# Patient Record
Sex: Male | Born: 1990 | Race: Black or African American | Hispanic: No | Marital: Single | Smoking: Current every day smoker
Health system: Southern US, Community
[De-identification: ages and names within clinical notes are randomized; demographics above are authoritative.]

## PROBLEM LIST (undated history)

## (undated) DIAGNOSIS — K279 Peptic ulcer, site unspecified, unspecified as acute or chronic, without hemorrhage or perforation: Secondary | ICD-10-CM

---
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
## 2009-05-30 ENCOUNTER — Emergency Department (HOSPITAL_COMMUNITY): Admission: EM | Admit: 2009-05-30 | Discharge: 2009-05-30 | Payer: Self-pay | Admitting: Emergency Medicine

## 2011-02-22 LAB — BASIC METABOLIC PANEL
BUN: 10 mg/dL (ref 6–23)
BUN: 7 mg/dL (ref 6–23)
BUN: 7 mg/dL (ref 6–23)
CO2: 24 mEq/L (ref 19–32)
CO2: 26 mEq/L (ref 19–32)
Calcium: 9 mg/dL (ref 8.4–10.5)
Calcium: 9 mg/dL (ref 8.4–10.5)
Chloride: 105 mEq/L (ref 96–112)
Chloride: 108 mEq/L (ref 96–112)
Chloride: 110 mEq/L (ref 96–112)
Creatinine, Ser: 0.96 mg/dL (ref 0.4–1.5)
Creatinine, Ser: 0.99 mg/dL (ref 0.4–1.5)
Creatinine, Ser: 1.01 mg/dL (ref 0.4–1.5)
GFR calc Af Amer: >60 mL/min (ref >60)
GFR calc Af Amer: >60 mL/min (ref >60)
GFR calc Af Amer: >60 mL/min (ref >60)
GFR calc Af Amer: >60 mL/min (ref >60)
GFR calc non Af Amer: >60 mL/min (ref >60)
GFR calc non Af Amer: >60 mL/min (ref >60)
GFR calc non Af Amer: >60 mL/min (ref >60)
GFR calc non Af Amer: >60 mL/min (ref >60)
GFR calc non Af Amer: >60 mL/min (ref >60)
GFR calc non Af Amer: >60 mL/min (ref >60)
Glucose, Bld: 103 mg/dL — ABNORMAL HIGH (ref 70–99)
Glucose, Bld: 125 mg/dL — ABNORMAL HIGH (ref 70–99)
Glucose, Bld: 81 mg/dL (ref 70–99)
Glucose, Bld: 83 mg/dL (ref 70–99)
Potassium: 3.6 mEq/L (ref 3.5–5.1)
Potassium: 3.8 mEq/L (ref 3.5–5.1)
Potassium: 3.9 mEq/L (ref 3.5–5.1)
Potassium: 4 mEq/L (ref 3.5–5.1)
Potassium: 4.1 mEq/L (ref 3.5–5.1)
Sodium: 137 mEq/L (ref 135–145)
Sodium: 138 mEq/L (ref 135–145)
Sodium: 138 mEq/L (ref 135–145)
Sodium: 139 mEq/L (ref 135–145)
Sodium: 140 mEq/L (ref 135–145)
Sodium: 141 mEq/L (ref 135–145)

## 2011-02-22 LAB — PHOSPHORUS
Phosphorus: 3.1 mg/dL (ref 2.3–4.6)
Phosphorus: 3.9 mg/dL (ref 2.3–4.6)

## 2011-02-22 LAB — HEPATIC FUNCTION PANEL
ALT: 133 U/L — ABNORMAL HIGH (ref 0–53)
AST: 359 U/L — ABNORMAL HIGH (ref 0–37)
Albumin: 3.6 g/dL (ref 3.5–5.2)
Total Protein: 6.8 g/dL (ref 6.0–8.3)

## 2011-02-22 LAB — URINE MICROSCOPIC-ADD ON

## 2011-02-22 LAB — ANA: Anti Nuclear Antibody(ANA): NEGATIVE

## 2011-02-22 LAB — DIFFERENTIAL
Basophils Absolute: 0 10*3/uL (ref 0.0–0.1)
Basophils Absolute: 0.1 10*3/uL (ref 0.0–0.1)
Eosinophils Absolute: 0.4 10*3/uL (ref 0.0–0.7)
Eosinophils Relative: 5 % (ref 0–5)
Eosinophils Relative: 7 % — ABNORMAL HIGH (ref 0–5)
Lymphocytes Relative: 47 % — ABNORMAL HIGH (ref 12–46)
Lymphocytes Relative: 55 % — ABNORMAL HIGH (ref 12–46)
Lymphs Abs: 3.3 10*3/uL (ref 0.7–4.0)
Monocytes Absolute: 0.6 10*3/uL (ref 0.1–1.0)
Monocytes Relative: 8 % (ref 3–12)
Neutrophils Relative %: 29 % — ABNORMAL LOW (ref 43–77)

## 2011-02-22 LAB — CARDIAC PANEL(CRET KIN+CKTOT+MB+TROPI)
CK, MB: 19.8 ng/mL — ABNORMAL HIGH (ref 0.3–4.0)
CK, MB: 20.7 ng/mL — ABNORMAL HIGH (ref 0.3–4.0)
Relative Index: 0.1 (ref 0.0–2.5)
Relative Index: 0.1 (ref 0.0–2.5)
Troponin I: 0.01 ng/mL (ref 0.00–0.06)
Troponin I: 0.01 ng/mL (ref 0.00–0.06)

## 2011-02-22 LAB — POCT I-STAT, CHEM 8
BUN: 9 mg/dL (ref 6–23)
Calcium, Ion: 1.19 mmol/L (ref 1.12–1.32)
Glucose, Bld: 78 mg/dL (ref 70–99)
TCO2: 26 mmol/L (ref 0–100)

## 2011-02-22 LAB — CK: Total CK: 37465 U/L — ABNORMAL HIGH (ref 7–232)

## 2011-02-22 LAB — COMPREHENSIVE METABOLIC PANEL
BUN: 5 mg/dL — ABNORMAL LOW (ref 6–23)
CO2: 24 mEq/L (ref 19–32)
Calcium: 9.1 mg/dL (ref 8.4–10.5)
Chloride: 106 mEq/L (ref 96–112)
Creatinine, Ser: 1.03 mg/dL (ref 0.4–1.5)
GFR calc Af Amer: >60 mL/min (ref >60)
GFR calc non Af Amer: >60 mL/min (ref >60)
Total Bilirubin: 0.7 mg/dL (ref 0.3–1.2)

## 2011-02-22 LAB — ALDOLASE: Aldolase: 403 U/L — ABNORMAL HIGH (ref <=8.1)

## 2011-02-22 LAB — MALARIA SMEAR

## 2011-02-22 LAB — CBC
HCT: 41.4 % (ref 39.0–52.0)
HCT: 42.2 % (ref 39.0–52.0)
HCT: 45.2 % (ref 39.0–52.0)
Hemoglobin: 13.5 g/dL (ref 13.0–17.0)
Hemoglobin: 15.1 g/dL (ref 13.0–17.0)
MCHC: 33 g/dL (ref 30.0–36.0)
MCHC: 33.5 g/dL (ref 30.0–36.0)
MCV: 88.9 fL (ref 78.0–100.0)
MCV: 88.9 fL (ref 78.0–100.0)
Platelets: 165 10*3/uL (ref 150–400)
Platelets: 166 10*3/uL (ref 150–400)
RBC: 4.56 MIL/uL (ref 4.22–5.81)
RBC: 5.11 MIL/uL (ref 4.22–5.81)
RDW: 14.1 % (ref 11.5–15.5)
RDW: 14.2 % (ref 11.5–15.5)
RDW: 14.6 % (ref 11.5–15.5)
WBC: 5.3 10*3/uL (ref 4.0–10.5)
WBC: 5.9 10*3/uL (ref 4.0–10.5)

## 2011-02-22 LAB — URINALYSIS, ROUTINE W REFLEX MICROSCOPIC
Glucose, UA: NEGATIVE mg/dL
Specific Gravity, Urine: 1.006 (ref 1.005–1.030)
pH: 6.5 (ref 5.0–8.0)

## 2011-02-22 LAB — JO-1 ANTIBODY-IGG: Jo-1 Antibody, IgG: <0.2 AI (ref <1.0)

## 2011-02-22 LAB — CK TOTAL AND CKMB (NOT AT ARMC)
Relative Index: 0 (ref 0.0–2.5)
Relative Index: 0.1 (ref 0.0–2.5)
Total CK: 14174 U/L — ABNORMAL HIGH (ref 7–232)

## 2011-02-22 LAB — ETHANOL: Alcohol, Ethyl (B): <5 mg/dL (ref 0–10)

## 2011-02-22 LAB — PROTIME-INR: INR: 1.1 (ref 0.00–1.49)

## 2011-02-22 LAB — RAPID URINE DRUG SCREEN, HOSP PERFORMED
Barbiturates: NOT DETECTED
Opiates: NOT DETECTED

## 2011-02-22 LAB — HEPATITIS PANEL, ACUTE
HCV Ab: NEGATIVE
Hep B C IgM: NEGATIVE
Hepatitis B Surface Ag: POSITIVE — AB

## 2011-02-22 LAB — HEPATITIS B DNA, ULTRAQUANTITATIVE, PCR: Hepatitis B DNA: >110000000 IU/mL — ABNORMAL HIGH (ref <29)

## 2011-02-22 LAB — TSH: TSH: 1.596 u[IU]/mL (ref 0.350–4.500)

## 2011-02-22 LAB — HEPATITIS B E ANTIBODY: Hep B E Ab: NEGATIVE

## 2011-02-22 LAB — HEPATITIS B SURFACE ANTIBODY,QUALITATIVE: Hep B S Ab: NEGATIVE

## 2011-02-22 LAB — HEPATITIS B E ANTIGEN: Hep B E Ag: POSITIVE

## 2011-02-22 LAB — POCT CARDIAC MARKERS: Troponin i, poc: <0.05 ng/mL (ref 0.00–0.09)

## 2011-03-23 NOTE — Discharge Summary (Signed)
NAMENORRIS, BRUMBACH NO.:  [id number]   MEDICAL RECORD NO.:  [id number]          PATIENT TYPE:  INP   LOCATION:  5530                         FACILITY:  [facility]   PHYSICIAN:  Manning Charity, MD     DATE OF BIRTH:  1991-02-08   DATE OF ADMISSION:  11/14/2008  DATE OF DISCHARGE:  11/18/2008                               DISCHARGE SUMMARY   CONSULTS:  None.   FOLLOWING PHYSICIAN:  Outpatient Clinic, Dr. Elizebeth Koller.   DISCHARGE DIAGNOSES:  1. Rhabdomyolysis.  2. Prior history of malaria.   DISPOSITION AND FOLLOWUP:  Mr. Mcpheeters Maahir will follow with Dr.  Elizebeth Koller at the Outpatient Clinic on December 05, 2008, at 1:30 p.m.  During that appointment, labs that need to be followed, workup for  polymyositis, aldolase level, ANA, and anti-AO1 antibody, malaria smear,  hepatitis B serology.  Other labs that need to be drawn with that  appointment at [facility] and a CK level.  During that appointment also  improvement and resolution of his epigastric pain need to be followed  up.  His pain as of now resolved with trail of ranitidine, consider H  Pylori antigen if symptoms does not improved.   HISTORY OF PRESENT ILLNESS:  An 20 year old with a non-significant past  medical history who presented to the emergency department complaining of  chest pain and arm pain.  He relate that the chest pain started 3 days  prior to admission.  He describes it being a sharp, is constant 8/10.  He relate that the pain is starting  after he was doing exercise.  He  relate that he usually exercise when he was living in [place], but he is  starting to do exercise again 1 week prior to admission.  He was doing  heavy lifting.  He denies fevers, chills, dysuria, change in the color  of stool or change in the color of the urine.  He denies abdominal pain  at this moment.   PHYSICAL EXAMINATION:  VITAL SIGNS:  Temperature 98.3, blood pressure  was 155/75, pulse 62, respirations 20, and sats 99% on  room air.  GENERAL:  The patient was in no acute distress.  EYES:  Pupils equal and reactive to light and anicteric.  ENT, no  tonsillar enlargement.  No erythema.  RESPIRATION:  Bilateral air movement.  No wheezing.  No rales.  No  rhonchi.  CARDIOVASCULAR:  S1 and S2.  Regular rhythm and rate.  No murmur.  NEUROLOGIC:  He was alert and oriented x3.  Motor strength 5/5  throughout.  Deep tendon reflex is +2.  Sensation is grossly intact.  Cranial nerve II through XII intact.  Romberg sign is negative.   ADMISSION LABORATORY DATA:  UA, bacteria rare, red blood cells 0-2,  epithelial cells rare, troponin 0.05, myoglobin more than 500, and total  CK 29,000.  Sodium 140, potassium 4.0, chloride 105, bicarb 26, BUN 9,  creatinine 1.2, and glucose 78.  White blood cell 7.7, hemoglobin 15.1,  hematocrit 45.2, and platelets 190.  AST 259, ALT 133, and albumin 3.6.  UDS negative.  Alcohol negative.  Phosphate 3.9, magnesium 1.9, and TSH  1.596.   HOSPITAL COURSE:  1. Rhabdomyolysis.  Mr. Sporer was admitted to regular floor.  He was      started on IV fluid.  He got 2 L in the ED department.  He was      given 500 mL bolus x2 and then 200 mL/hour.  He was having good      urine output.  BMET was checked in every 12 hours without any renal      failure or electrolytes abnormality and his phosphate level was      normal at 3.1.  His rhabdomyolysis was thought to be secondary to      excessive exercise.  Other cause of rhabdomyolysis were ruled out      like HIV, it was negative.  UDS was negative for any of the drug.      TSH was normal.  Malaria smear was order to rule out rhabdomyolysis      secondary to malaria.  Malaria smear is pending at this moment.      Also, ESR was ordered and it was mildly elevated, so it was decided      to do workup for polymyositis, aldolase, and ANA is pending.  This      lab need to be followed up as an outpatient.  The patient was given      IV morphine to help  with pain.  he was started then on Ultram for      pain control.  CK was trending down from 47, 000, decreased to      37,000, and on the day of discharge, CK level was 14,000.   1. Transaminitis.  His AST and ALT were elevated.  These could be      secondary to rhabdomyolysis and also secondary to hepatitis.  His      hepatitis B antigen surface was positive.  Hepatitis antibody,      hepatitis B core, and hepatitis B DNA is pending.  This need to be      followed up as an outpatient.  He would need liver function test as      an outpatient.   1. Epigastric pain.  We were thinking that this was likely secondary      to gastritis, secondary to spicy food.  He was given ranitidine and      this seem to help with his pain.  If pain does not resolve with      ranitidine, he might need H. pylori test or endoscopy.   On the day of discharge, the patient was in good condition.  His CK was  trending down.  No renal failure.  Creatinine of 1.03, hemoglobin 13.7,  hematocrit 41.4, platelet 166 and total CK 1400.  The patient was  discharged in good condition.      Hartley Barefoot, MD  Electronically Signed      Manning Charity, MD  Electronically Signed    BR/MEDQ  D:  11/18/2008  T:  11/19/2008  Job:  [id number]

## 2011-08-13 LAB — CBC
Hemoglobin: 14.9 g/dL (ref 13.0–17.0)
RBC: 5.11 MIL/uL (ref 4.22–5.81)
WBC: 6.2 10*3/uL (ref 4.0–10.5)

## 2011-08-13 LAB — URINALYSIS, ROUTINE W REFLEX MICROSCOPIC
Glucose, UA: NEGATIVE mg/dL
Hgb urine dipstick: NEGATIVE
Protein, ur: NEGATIVE mg/dL
pH: 7 (ref 5.0–8.0)

## 2011-08-13 LAB — LIPASE, BLOOD: Lipase: 22 U/L (ref 11–59)

## 2011-08-13 LAB — COMPREHENSIVE METABOLIC PANEL
ALT: 53 U/L (ref 0–53)
AST: 89 U/L — ABNORMAL HIGH (ref 0–37)
Alkaline Phosphatase: 99 U/L (ref 39–117)
CO2: 24 mEq/L (ref 19–32)
Calcium: 9.5 mg/dL (ref 8.4–10.5)
Chloride: 112 mEq/L (ref 96–112)
GFR calc Af Amer: >60 mL/min (ref >60)
GFR calc non Af Amer: >60 mL/min (ref >60)
Glucose, Bld: 108 mg/dL — ABNORMAL HIGH (ref 70–99)
Potassium: 4.4 mEq/L (ref 3.5–5.1)
Sodium: 141 mEq/L (ref 135–145)
Total Bilirubin: 0.7 mg/dL (ref 0.3–1.2)

## 2011-08-13 LAB — DIFFERENTIAL
Basophils Relative: 1 % (ref 0–1)
Eosinophils Absolute: 0.8 10*3/uL — ABNORMAL HIGH (ref 0.0–0.7)
Eosinophils Relative: 12 % — ABNORMAL HIGH (ref 0–5)
Lymphs Abs: 2.7 10*3/uL (ref 0.7–4.0)
Neutrophils Relative %: 35 % — ABNORMAL LOW (ref 43–77)

## 2011-11-30 ENCOUNTER — Encounter (HOSPITAL_COMMUNITY): Payer: Self-pay | Admitting: Emergency Medicine

## 2011-11-30 ENCOUNTER — Emergency Department (HOSPITAL_COMMUNITY)
Admission: EM | Admit: 2011-11-30 | Discharge: 2011-11-30 | Disposition: A | Discharge status: Home / Self Care | Payer: Self-pay | Attending: Emergency Medicine | Admitting: Emergency Medicine

## 2011-11-30 DIAGNOSIS — K047 Periapical abscess without sinus: Secondary | ICD-10-CM | POA: Insufficient documentation

## 2011-11-30 DIAGNOSIS — R220 Localized swelling, mass and lump, head: Secondary | ICD-10-CM | POA: Insufficient documentation

## 2011-11-30 DIAGNOSIS — R259 Unspecified abnormal involuntary movements: Secondary | ICD-10-CM | POA: Insufficient documentation

## 2011-11-30 DIAGNOSIS — K089 Disorder of teeth and supporting structures, unspecified: Secondary | ICD-10-CM | POA: Insufficient documentation

## 2011-11-30 DIAGNOSIS — K029 Dental caries, unspecified: Secondary | ICD-10-CM | POA: Insufficient documentation

## 2011-11-30 MED ORDER — OXYCODONE-ACETAMINOPHEN 5-325 MG PO TABS
1.0000 | ORAL_TABLET | Freq: Four times a day (QID) | ORAL | Status: AC | PRN
Start: 2011-11-30 — End: 2011-12-10

## 2011-11-30 MED ORDER — PENICILLIN V POTASSIUM 500 MG PO TABS: 500.0000 mg | ORAL_TABLET | Freq: Four times a day (QID) | ORAL | Status: AC

## 2011-11-30 NOTE — ED Provider Notes (Signed)
History     CSN: [id number]  Arrival date & time 11/30/11  0909   First MD Initiated Contact with Patient 11/30/11 [phone]      Chief Complaint  Patient presents with  . Dental Pain    (Consider location/radiation/quality/duration/timing/severity/associated sxs/prior treatment) Patient is a 21 y.o. male presenting with tooth pain. The history is provided by the patient.  Dental PainThe primary symptoms include mouth pain. Primary symptoms do not include headaches, fever, shortness of breath, sore throat, angioedema or cough. The symptoms began 5 to 7 days ago. The symptoms are worsening. The symptoms are new. The symptoms occur constantly.  Additional symptoms include: gum swelling, gum tenderness, trismus and facial swelling. Additional symptoms do not include: drooling, ear pain and swollen glands. Medical issues include: smoking.   patient was given a Percocet for his pain by his mother with some relief in symptoms.  History reviewed. No pertinent past medical history.  History reviewed. No pertinent past surgical history.  History reviewed. No pertinent family history.  History  Substance Use Topics  . Smoking status: Current Everyday Smoker  . Smokeless tobacco: Not on file  . Alcohol Use: Yes      Review of Systems  Constitutional: Negative for fever and chills.  HENT: Positive for facial swelling and dental problem. Negative for ear pain, congestion, sore throat, drooling, neck pain, neck stiffness, voice change and tinnitus.   Eyes: Negative for pain and visual disturbance.  Respiratory: Negative for cough and shortness of breath.   Cardiovascular: Negative for chest pain.  Gastrointestinal: Negative for nausea, vomiting and abdominal pain.  Musculoskeletal: Negative for back pain and gait problem.  Skin: Negative for color change and wound.  Neurological: Negative for dizziness, syncope, light-headedness and headaches.  Psychiatric/Behavioral: Negative for  confusion.    Allergies  Review of patient's allergies indicates no known allergies.  Home Medications   Current Outpatient Rx  Name Route Sig Dispense Refill  . OXYCODONE-ACETAMINOPHEN 5-325 MG PO TABS Oral Take 1 tablet by mouth every 4 (four) hours as needed. For pain      BP 147/67  Pulse 67  Temp(Src) 98.4 F (36.9 C) (Oral)  Resp 24  SpO2 97%  Physical Exam  Nursing note and vitals reviewed. Constitutional: He is oriented to person, place, and time. He appears well-developed and well-nourished.       Uncomfortable appearing  HENT:  Head: Normocephalic and atraumatic. There is trismus in the jaw.    Right Ear: External ear normal.  Left Ear: External ear normal.  Mouth/Throat: Mucous membranes are normal. Dental abscesses and dental caries present. No uvula swelling. No oropharyngeal exudate.    Eyes: Pupils are equal, round, and reactive to light.  Neck: Normal range of motion. Neck supple.       No neck swelling  Cardiovascular: Normal rate and regular rhythm.   Pulmonary/Chest: Effort normal. No respiratory distress.  Abdominal: Soft. He exhibits no distension. There is no tenderness.  Musculoskeletal: He exhibits no edema and no tenderness.  Lymphadenopathy:    He has no cervical adenopathy.  Neurological: He is alert and oriented to person, place, and time. No cranial nerve deficit.  Skin: Skin is warm and dry. No rash noted. No erythema.    ED Course  Procedures (including critical care time)  Labs Reviewed - No data to display No results found.   Dx 1:Dental  Abscess   MDM  Dental abscess. No signs of Ludwig angina. I spoke with the dentist on  call, Dr. Leanord Asal who advises that the patient should call either his office or that of the oral surgeon on call for close followup. He'll be given a prescription for penicillin as well as pain medication. Patient voices understanding of the plan and the urgent need for appropriate  followup.        Shaaron Adler, [place] 11/30/11 [address] [place], [place] 11/30/11 1047

## 2011-11-30 NOTE — ED Notes (Signed)
Pt here with tooth ache and swelling to right side of face; swelling noted x 4 days

## 2011-11-30 NOTE — ED Provider Notes (Signed)
Medical screening examination/treatment/procedure(s) were performed by non-physician practitioner and as supervising physician I was immediately available for consultation/collaboration.   Gerhard Munch, MD 11/30/11 1530

## 2012-04-03 ENCOUNTER — Emergency Department (INDEPENDENT_AMBULATORY_CARE_PROVIDER_SITE_OTHER)
Admission: EM | Admit: 2012-04-03 | Discharge: 2012-04-03 | Disposition: A | Discharge status: Home / Self Care | Payer: Self-pay | Source: Home / Self Care | Attending: Emergency Medicine | Admitting: Emergency Medicine

## 2012-04-03 ENCOUNTER — Emergency Department (INDEPENDENT_AMBULATORY_CARE_PROVIDER_SITE_OTHER): Payer: Self-pay

## 2012-04-03 ENCOUNTER — Encounter (HOSPITAL_COMMUNITY): Payer: Self-pay | Admitting: Emergency Medicine

## 2012-04-03 DIAGNOSIS — S63509A Unspecified sprain of unspecified wrist, initial encounter: Secondary | ICD-10-CM

## 2012-04-03 MED ORDER — MELOXICAM 15 MG PO TABS: 15.0000 mg | ORAL_TABLET | Freq: Every day | ORAL | Status: AC

## 2012-04-03 MED ORDER — TRAMADOL HCL 50 MG PO TABS: 100.0000 mg | ORAL_TABLET | Freq: Three times a day (TID) | ORAL | Status: AC | PRN

## 2012-04-03 NOTE — ED Notes (Signed)
Provided ice pack

## 2012-04-03 NOTE — ED Notes (Signed)
Patient is not ready for discharge, patient to go to xray

## 2012-04-03 NOTE — ED Provider Notes (Signed)
Chief Complaint  Patient presents with  . Arm Pain    History of Present Illness:   The patient injured his right wrist this afternoon. He slipped and fell backwards, catching himself on outstretched hand. Ever since then he's had pain centered around the wrist with radiation up into the forearm and down to the hand. He has numbness and tingling in his hand and states he cannot feel his fingers. He is able to move his hand but with pain.  Review of Systems:  Other than noted above, the patient denies any of the following symptoms: Systemic:  No fevers, chills, sweats, or aches.  No fatigue or tiredness. Musculoskeletal:  No joint pain, arthritis, bursitis, swelling, back pain, or neck pain. Neurological:  No muscular weakness, paresthesias, headache, or trouble with speech or coordination.  No dizziness.   PMFSH:  Past medical history, family history, social history, meds, and allergies were reviewed.  Physical Exam:   Vital signs:  BP 143/73  Pulse 55  Temp(Src) 97.9 F (36.6 C) (Oral)  Resp 16  SpO2 100% Gen:  Alert and oriented times 3.  In no distress. Musculoskeletal: He has diffuse pain from elbow to the tips of the fingers. No swelling, bruising, or deformity. He is able to move all joints but with pain. He states he cannot feel light touch in the tips of his fingers. Muscle strength is normal Otherwise, all joints had a full a ROM with no swelling, bruising or deformity.  No edema, pulses full. Extremities were warm and pink.  Capillary refill was brisk.  Skin:  Clear, warm and dry.  No rash. Neuro:  Alert and oriented times 3.  Muscle strength was normal.  Sensation was intact to light touch.   Radiology:  Dg Wrist Complete Right  04/03/2012  *RADIOLOGY REPORT*  Clinical Data: Larey Seat.  Right wrist pain.  RIGHT WRIST - COMPLETE 3+ VIEW  Comparison: None.  Findings: There is no evidence for acute fracture or dislocation. No soft tissue foreign body or gas identified.  Intercarpal  spaces are normal.  IMPRESSION: Negative exam.  Original Report Authenticated By: Patterson Hammersmith, M.D.   Course in Urgent Care Center:   He was put in a thumb spica splint and told to followup with Dr. Carola Frost in one week.  Assessment:  The encounter diagnosis was Wrist sprain.  Plan:   1.  The following meds were prescribed:   New Prescriptions   MELOXICAM (MOBIC) 15 MG TABLET    Take 1 tablet (15 mg total) by mouth daily.   TRAMADOL (ULTRAM) 50 MG TABLET    Take 2 tablets (100 mg total) by mouth every 8 (eight) hours as needed for pain.   2.  The patient was instructed in symptomatic care, including rest and activity, elevation, application of ice and compression.  Appropriate handouts were given. 3.  The patient was told to return if becoming worse in any way, if no better in 3 or 4 days, and given some red flag symptoms that would indicate earlier return.   4.  The patient was told to follow up with Dr. Carola Frost in one week.   Reuben Likes, MD 04/03/12 [phone]

## 2012-04-03 NOTE — Discharge Instructions (Signed)
Wear splint continuously and do not get it wet.  Follow up with orthopedist in 1 week.

## 2012-04-03 NOTE — ED Notes (Signed)
Reports a fall today.  Walking down a hill, feet slid out and patient fell, landing on right hand.  Points to right wrist as location of pain, moves all fingers.  Patient appears to be very tender in wrist.  C/o pain to checking pulses in wrist, radial pulses 2 plus.  Wrist looks different than left wrist.

## 2013-05-23 ENCOUNTER — Emergency Department (HOSPITAL_COMMUNITY)
Admission: EM | Admit: 2013-05-23 | Discharge: 2013-05-23 | Disposition: A | Discharge status: Home / Self Care | Payer: Self-pay | Attending: Emergency Medicine | Admitting: Emergency Medicine

## 2013-05-23 ENCOUNTER — Encounter (HOSPITAL_COMMUNITY): Payer: Self-pay | Admitting: Emergency Medicine

## 2013-05-23 DIAGNOSIS — Y929 Unspecified place or not applicable: Secondary | ICD-10-CM | POA: Insufficient documentation

## 2013-05-23 DIAGNOSIS — F17200 Nicotine dependence, unspecified, uncomplicated: Secondary | ICD-10-CM | POA: Insufficient documentation

## 2013-05-23 DIAGNOSIS — W540XXA Bitten by dog, initial encounter: Secondary | ICD-10-CM | POA: Insufficient documentation

## 2013-05-23 DIAGNOSIS — S81009A Unspecified open wound, unspecified knee, initial encounter: Secondary | ICD-10-CM | POA: Insufficient documentation

## 2013-05-23 DIAGNOSIS — Z23 Encounter for immunization: Secondary | ICD-10-CM | POA: Insufficient documentation

## 2013-05-23 DIAGNOSIS — S81851A Open bite, right lower leg, initial encounter: Secondary | ICD-10-CM

## 2013-05-23 DIAGNOSIS — Y93K1 Activity, walking an animal: Secondary | ICD-10-CM | POA: Insufficient documentation

## 2013-05-23 HISTORY — DX: Peptic ulcer, site unspecified, unspecified as acute or chronic, without hemorrhage or perforation: K27.9

## 2013-05-23 MED ORDER — IBUPROFEN 400 MG PO TABS
800.0000 mg | ORAL_TABLET | Freq: Once | ORAL | Status: AC
  Administered 2013-05-23: 800 mg via ORAL
  Filled 2013-05-23: qty 2

## 2013-05-23 MED ORDER — RABIES IMMUNE GLOBULIN 150 UNIT/ML IM INJ
20.0000 [IU]/kg | INJECTION | Freq: Once | INTRAMUSCULAR | Status: AC
  Administered 2013-05-23: 1425 [IU] via INTRAMUSCULAR
  Filled 2013-05-23: qty 9.5

## 2013-05-23 MED ORDER — RABIES VACCINE, PCEC IM SUSR
1.0000 mL | Freq: Once | INTRAMUSCULAR | Status: AC
  Administered 2013-05-23: 1 mL via INTRAMUSCULAR
  Filled 2013-05-23: qty 1

## 2013-05-23 MED ORDER — HYDROCODONE-ACETAMINOPHEN 5-325 MG PO TABS: 1.0000 | ORAL_TABLET | Freq: Four times a day (QID) | ORAL | Status: DC | PRN

## 2013-05-23 MED ORDER — AMOXICILLIN-POT CLAVULANATE 875-125 MG PO TABS: 1.0000 | ORAL_TABLET | Freq: Two times a day (BID) | ORAL | Status: DC

## 2013-05-23 NOTE — ED Notes (Signed)
PA infiltrated wounds with 2ml Rabies Immune Globulin.

## 2013-05-23 NOTE — ED Notes (Signed)
Pt c/o dog  Bite to right lower leg. Bleeding controlled. Pt unable to find out if the dog has had his shots.

## 2013-05-23 NOTE — ED Provider Notes (Signed)
History  This chart was scribed for Glade Nurse, PA-C working with Ashby Dawes, MD by Greggory Stallion, ED scribe. This patient was seen in room TR07C/TR07C and the patient's care was started at 4:49 PM.  CSN: [id number] Arrival date & time 05/23/13  1627   Chief Complaint  Patient presents with  . Animal Bite   The history is provided by the patient. No language interpreter was used.    HPI Comments: Isaiah Suarez is a 22 y.o. male who presents to the Emergency Department complaining of a dog bite to his right lower leg with associated throbbing right leg pain earlier today. Pt was walking a a dog he believes to be a pit bull bit him. He does not know the dog and does not know the status of the dog's rabies vaccination. He rates his pain 6/10, constant, localized to the wounded area. Pt states his last tetanus was 3 years ago. Pt denies fever, nausea, vomiting, headache, dizziness, chest pain, shortness of breath.   No past medical history on file. No past surgical history on file. No family history on file. History  Substance Use Topics  . Smoking status: Current Every Day Smoker  . Smokeless tobacco: Not on file  . Alcohol Use: Yes    Review of Systems  Constitutional: Negative for fever and diaphoresis.  HENT: Negative for neck pain and neck stiffness.   Eyes: Negative for visual disturbance.  Respiratory: Negative for apnea, chest tightness and shortness of breath.   Cardiovascular: Negative for chest pain and palpitations.  Gastrointestinal: Negative for nausea, vomiting, diarrhea and constipation.  Genitourinary: Negative for dysuria.  Musculoskeletal: Positive for myalgias. Negative for gait problem.  Skin: Positive for wound. Negative for rash.  Neurological: Negative for dizziness, weakness, light-headedness, numbness and headaches.    Allergies  Review of patient's allergies indicates no known allergies.  Home Medications   Current Outpatient Rx  Name   Route  Sig  Dispense  Refill  . oxyCODONE-acetaminophen (PERCOCET) 5-325 MG per tablet   Oral   Take 1 tablet by mouth every 4 (four) hours as needed. For pain          BP 152/78  Pulse 78  Temp(Src) 98.3 F (36.8 C) (Oral)  Resp 18  SpO2 100%  Physical Exam  Nursing note and vitals reviewed. Constitutional: He is oriented to person, place, and time. He appears well-developed and well-nourished. No distress.  HENT:  Head: Normocephalic and atraumatic.  Eyes: Conjunctivae and EOM are normal.  Neck: Normal range of motion. Neck supple.  No meningeal signs  Cardiovascular: Normal rate, regular rhythm and normal heart sounds.  Exam reveals no gallop and no friction rub.   No murmur heard. Pulmonary/Chest: Effort normal and breath sounds normal. No respiratory distress. He has no wheezes. He has no rales. He exhibits no tenderness.  Abdominal: Soft. Bowel sounds are normal. He exhibits no distension. There is no tenderness. There is no rebound and no guarding.  Musculoskeletal: Normal range of motion. He exhibits no edema and no tenderness.  Neurological: He is alert and oriented to person, place, and time. No cranial nerve deficit.  Skin: Skin is warm and dry. He is not diaphoretic. No erythema.  Right medial aspect of lower right leg has 1.5 cm laceration deep to epidermis and an adjacent 0.5 cm laceration deep to epidermis. No active bleeding. No swelling. No bruising.     ED Course  Procedures (including critical care time)  DIAGNOSTIC STUDIES: Oxygen  Saturation is 100% on RA, normal by my interpretation.    COORDINATION OF CARE: 5:11 PM-Discussed treatment plan which includes pain medication in ED, rabies shots, cleaning the wound, and antibiotics with pt at bedside and pt agreed to plan.   Labs Reviewed - No data to display No results found. 1. Animal bite of lower leg, right, initial encounter      MDM  Dog bite wounds are deep to epidermis only. Wound cleaned with  soap and water and pressure irrigation with saline. Pt UTD with tetanus. Pain managed in the ED. Will start rabies series here and provide schedule for additional shots. Discussed risk/benefits with pt who agrees to tx at this time.    Pt did leave without his script for Augmentin. Discussed with secretary who will call the pt and get the script to him.   I personally performed the services described in this documentation, which was scribed in my presence. The recorded information has been reviewed and is accurate.    Glade Nurse, PA-C 05/24/13 [phone]

## 2013-05-23 NOTE — ED Notes (Signed)
Pt states he got a DT when he got his green card 3 years ago.

## 2013-05-24 NOTE — ED Provider Notes (Signed)
Medical screening examination/treatment/procedure(s) were performed by non-physician practitioner and as supervising physician I was immediately available for consultation/collaboration.   Ashby Dawes, MD 05/24/13 [phone]

## 2013-09-26 ENCOUNTER — Emergency Department (HOSPITAL_COMMUNITY)
Admission: EM | Admit: 2013-09-26 | Discharge: 2013-09-26 | Disposition: A | Discharge status: Home / Self Care | Payer: Self-pay | Attending: Emergency Medicine | Admitting: Emergency Medicine

## 2013-09-26 ENCOUNTER — Encounter (HOSPITAL_COMMUNITY): Payer: Self-pay | Admitting: Emergency Medicine

## 2013-09-26 DIAGNOSIS — R1310 Dysphagia, unspecified: Secondary | ICD-10-CM | POA: Insufficient documentation

## 2013-09-26 DIAGNOSIS — Z8711 Personal history of peptic ulcer disease: Secondary | ICD-10-CM | POA: Insufficient documentation

## 2013-09-26 DIAGNOSIS — R220 Localized swelling, mass and lump, head: Secondary | ICD-10-CM | POA: Insufficient documentation

## 2013-09-26 DIAGNOSIS — F17200 Nicotine dependence, unspecified, uncomplicated: Secondary | ICD-10-CM | POA: Insufficient documentation

## 2013-09-26 DIAGNOSIS — K029 Dental caries, unspecified: Secondary | ICD-10-CM | POA: Insufficient documentation

## 2013-09-26 DIAGNOSIS — K089 Disorder of teeth and supporting structures, unspecified: Secondary | ICD-10-CM | POA: Insufficient documentation

## 2013-09-26 DIAGNOSIS — R070 Pain in throat: Secondary | ICD-10-CM | POA: Insufficient documentation

## 2013-09-26 MED ORDER — PENICILLIN V POTASSIUM 500 MG PO TABS: 500.0000 mg | ORAL_TABLET | Freq: Four times a day (QID) | ORAL | Status: DC

## 2013-09-26 MED ORDER — OXYCODONE-ACETAMINOPHEN 5-325 MG PO TABS: 1.0000 | ORAL_TABLET | Freq: Four times a day (QID) | ORAL | Status: DC | PRN

## 2013-09-26 MED ORDER — OXYCODONE-ACETAMINOPHEN 5-325 MG PO TABS
2.0000 | ORAL_TABLET | Freq: Once | ORAL | Status: AC
  Administered 2013-09-26: 2 via ORAL
  Filled 2013-09-26: qty 2

## 2013-09-26 NOTE — ED Notes (Signed)
The pt has had a toothache for 2 days 

## 2013-09-26 NOTE — ED Provider Notes (Signed)
CSN: [id number]     Arrival date & time 09/26/13  1549 History  This chart was scribed for non-physician practitioner, Junius Finner, PA-C,working with Raeford Razor, MD, by Karle Plumber, ED Scribe.  This patient was seen in room TR06C/TR06C and the patient's care was started at 5:07 PM.  Chief Complaint  Patient presents with  . Dental Pain   The history is provided by the patient. No language interpreter was used.   HPI Comments:  Isaiah Suarez is a 22 y.o. male who presents to the Emergency Department complaining of right lower aching dental pain onset last night. Pt states he has experienced pain like this before in the past. He reports an associated nodule on the right side of his throat causing difficulty swallowing and associated pain. Pt denies taking anything at home for pain but reports using salt rinses with no relief. Pt denies SOB, nausea, vomiting, or fever. Pt states he smokes daily but denies using chewing tobacco. He states he does not have a dentist.   Past Medical History  Diagnosis Date  . Peptic ulcer    History reviewed. No pertinent past surgical history. No family history on file. History  Substance Use Topics  . Smoking status: Current Every Day Smoker  . Smokeless tobacco: Not on file  . Alcohol Use: Yes    Review of Systems  Constitutional: Negative for fever.  HENT: Positive for dental problem.        Anterior nodule of his throat.  Gastrointestinal: Negative for nausea, vomiting and diarrhea.  All other systems reviewed and are negative.    Allergies  Review of patient's allergies indicates no known allergies.  Home Medications   Current Outpatient Rx  Name  Route  Sig  Dispense  Refill  . oxyCODONE-acetaminophen (PERCOCET/ROXICET) 5-325 MG per tablet   Oral   Take 1-2 tablets by mouth every 6 (six) hours as needed for moderate pain or severe pain.   10 tablet   0   . penicillin v potassium (VEETID) 500 MG tablet   Oral   Take 1 tablet  (500 mg total) by mouth 4 (four) times daily.   40 tablet   0    Triage Vitals: BP 101/72  Pulse 77  Temp(Src) 98.8 F (37.1 C) (Oral)  Resp 18  Wt 163 lb 1.6 oz (73.982 kg)  SpO2 100% Physical Exam  Nursing note and vitals reviewed. Constitutional: He is oriented to person, place, and time. He appears well-developed and well-nourished.  HENT:  Head: Normocephalic and atraumatic.  Mouth/Throat: Uvula is midline, oropharynx is clear and moist and mucous membranes are normal. He does not have dentures. No oral lesions. No trismus in the jaw. Abnormal dentition. Dental caries present. No dental abscesses, uvula swelling or lacerations. No oropharyngeal exudate, posterior oropharyngeal edema, posterior oropharyngeal erythema or tonsillar abscesses.    Multiple dental carries. Missing tooth in right lower gingiva with surrounding edema and dental caries into pulp of last molar. Tender to palpation. No active drainage or surrounding induration.   Eyes: EOM are normal.  Neck: Normal range of motion.  Cardiovascular: Normal rate.   Pulmonary/Chest: Effort normal.  Musculoskeletal: Normal range of motion.  Neurological: He is alert and oriented to person, place, and time.  Skin: Skin is warm and dry.  Psychiatric: He has a normal mood and affect. His behavior is normal.    ED Course  Procedures (including critical care time) DIAGNOSTIC STUDIES: Oxygen Saturation is 100% on RA, normal by my  interpretation.   COORDINATION OF CARE: 5:11 PM- Will give pt something for pain here in ED. He states he is taking the bus home. Will provide pt with a list of area dentists and have pt f/u with Dr. Leanord Asal in 24-48hrs. Will prescribe oral Penicillin and percocet. Pt verbalizes understanding and agrees to plan.  Medications  oxyCODONE-acetaminophen (PERCOCET/ROXICET) 5-325 MG per tablet 2 tablet (not administered)    Labs Review Labs Reviewed - No data to display Imaging Review No results  found.  EKG Interpretation   None       MDM   1. Pain due to dental caries    Pt with dental caries requesting pain medication. No drainable abscess seen on exam.  Multiple dental caries in right lower jaw line. Will tx for pain and infection. Referred to Dr. Leanord Asal.  I personally performed the services described in this documentation, which was scribed in my presence. The recorded information has been reviewed and is accurate.    Junius Finner, PA-C 09/26/13 1719

## 2013-09-26 NOTE — ED Notes (Signed)
Pt discharged.Vital signs stable and GCS 15 

## 2013-09-27 NOTE — ED Provider Notes (Signed)
Medical screening examination/treatment/procedure(s) were performed by non-physician practitioner and as supervising physician I was immediately available for consultation/collaboration.  EKG Interpretation   None        Doyce Stonehouse, MD 09/27/13 0101 

## 2014-06-30 ENCOUNTER — Emergency Department (HOSPITAL_COMMUNITY): Payer: No Typology Code available for payment source

## 2014-06-30 ENCOUNTER — Encounter (HOSPITAL_COMMUNITY): Payer: Self-pay | Admitting: Emergency Medicine

## 2014-06-30 ENCOUNTER — Emergency Department (HOSPITAL_COMMUNITY)
Admission: EM | Admit: 2014-06-30 | Discharge: 2014-06-30 | Disposition: A | Discharge status: Home / Self Care | Payer: No Typology Code available for payment source | Attending: Emergency Medicine | Admitting: Emergency Medicine

## 2014-06-30 DIAGNOSIS — S63501A Unspecified sprain of right wrist, initial encounter: Secondary | ICD-10-CM

## 2014-06-30 DIAGNOSIS — Z792 Long term (current) use of antibiotics: Secondary | ICD-10-CM | POA: Insufficient documentation

## 2014-06-30 DIAGNOSIS — Y92410 Unspecified street and highway as the place of occurrence of the external cause: Secondary | ICD-10-CM | POA: Insufficient documentation

## 2014-06-30 DIAGNOSIS — S335XXA Sprain of ligaments of lumbar spine, initial encounter: Secondary | ICD-10-CM | POA: Insufficient documentation

## 2014-06-30 DIAGNOSIS — F17200 Nicotine dependence, unspecified, uncomplicated: Secondary | ICD-10-CM | POA: Insufficient documentation

## 2014-06-30 DIAGNOSIS — S39012A Strain of muscle, fascia and tendon of lower back, initial encounter: Secondary | ICD-10-CM

## 2014-06-30 DIAGNOSIS — S63509A Unspecified sprain of unspecified wrist, initial encounter: Secondary | ICD-10-CM | POA: Insufficient documentation

## 2014-06-30 DIAGNOSIS — S199XXA Unspecified injury of neck, initial encounter: Secondary | ICD-10-CM

## 2014-06-30 DIAGNOSIS — S0993XA Unspecified injury of face, initial encounter: Secondary | ICD-10-CM | POA: Insufficient documentation

## 2014-06-30 DIAGNOSIS — Z8711 Personal history of peptic ulcer disease: Secondary | ICD-10-CM | POA: Insufficient documentation

## 2014-06-30 DIAGNOSIS — IMO0002 Reserved for concepts with insufficient information to code with codable children: Secondary | ICD-10-CM | POA: Insufficient documentation

## 2014-06-30 DIAGNOSIS — Y9389 Activity, other specified: Secondary | ICD-10-CM | POA: Insufficient documentation

## 2014-06-30 MED ORDER — IBUPROFEN 800 MG PO TABS: 800.0000 mg | ORAL_TABLET | Freq: Three times a day (TID) | ORAL | Status: DC | PRN

## 2014-06-30 MED ORDER — HYDROCODONE-ACETAMINOPHEN 5-325 MG PO TABS: 1.0000 | ORAL_TABLET | Freq: Four times a day (QID) | ORAL | Status: DC | PRN

## 2014-06-30 NOTE — Discharge Instructions (Signed)
Return here as needed.  Followup with her primary care Dr. or urgent care.  Use ice and heat on areas that are sore, your x-rays were negative
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
## 2014-06-30 NOTE — ED Provider Notes (Signed)
Medical screening examination/treatment/procedure(s) were performed by non-physician practitioner and as supervising physician I was immediately available for consultation/collaboration.   EKG Interpretation None        Layla Maw Jacoba Cherney, DO 06/30/14 1504
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
## 2014-09-23 ENCOUNTER — Encounter (HOSPITAL_COMMUNITY): Payer: Self-pay | Admitting: Emergency Medicine

## 2014-09-23 ENCOUNTER — Emergency Department (INDEPENDENT_AMBULATORY_CARE_PROVIDER_SITE_OTHER)
Admission: EM | Admit: 2014-09-23 | Discharge: 2014-09-23 | Disposition: A | Discharge status: Home / Self Care | Payer: Self-pay | Source: Home / Self Care | Attending: Family Medicine | Admitting: Family Medicine

## 2014-09-23 DIAGNOSIS — M545 Low back pain, unspecified: Secondary | ICD-10-CM

## 2014-09-23 DIAGNOSIS — M542 Cervicalgia: Secondary | ICD-10-CM

## 2014-09-23 MED ORDER — CYCLOBENZAPRINE HCL 10 MG PO TABS: 10.0000 mg | ORAL_TABLET | Freq: Every evening | ORAL | Status: DC | PRN

## 2014-09-23 MED ORDER — DICLOFENAC SODIUM 50 MG PO TBEC: 50.0000 mg | DELAYED_RELEASE_TABLET | Freq: Two times a day (BID) | ORAL | Status: DC | PRN
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
## 2014-09-23 NOTE — Discharge Instructions (Signed)
Thank you for coming in today. Come back or go to the emergency room if you notice new weakness new numbness problems walking or bowel or bladder problems. Follow up with Dr. Farris HasKramer if not better   Back Exercises These exercises may help you when beginning to rehabilitate your injury. Your symptoms may resolve with or without further involvement from your physician, physical therapist or athletic trainer. While completing these exercises, remember:   Restoring tissue flexibility helps normal motion to return to the joints. This allows healthier, less painful movement and activity.  An effective stretch should be held for at least 30 seconds.  A stretch should never be painful. You should only feel a gentle lengthening or release in the stretched tissue. STRETCH - Extension, Prone on Elbows   Lie on your stomach on the floor, a bed will be too soft. Place your palms about shoulder width apart and at the height of your head.  Place your elbows under your shoulders. If this is too painful, stack pillows under your chest.  Allow your body to relax so that your hips drop lower and make contact more completely with the floor.  Hold this position for __________ seconds.  Slowly return to lying flat on the floor. Repeat __________ times. Complete this exercise __________ times per day.  RANGE OF MOTION - Extension, Prone Press Ups   Lie on your stomach on the floor, a bed will be too soft. Place your palms about shoulder width apart and at the height of your head.  Keeping your back as relaxed as possible, slowly straighten your elbows while keeping your hips on the floor. You may adjust the placement of your hands to maximize your comfort. As you gain motion, your hands will come more underneath your shoulders.  Hold this position __________ seconds.  Slowly return to lying flat on the floor. Repeat __________ times. Complete this exercise __________ times per day.  RANGE OF MOTION-  Quadruped, Neutral Spine   Assume a hands and knees position on a firm surface. Keep your hands under your shoulders and your knees under your hips. You may place padding under your knees for comfort.  Drop your head and point your tail bone toward the ground below you. This will round out your low back like an angry cat. Hold this position for __________ seconds.  Slowly lift your head and release your tail bone so that your back sags into a large arch, like an old horse.  Hold this position for __________ seconds.  Repeat this until you feel limber in your low back.  Now, find your "sweet spot." This will be the most comfortable position somewhere between the two previous positions. This is your neutral spine. Once you have found this position, tense your stomach muscles to support your low back.  Hold this position for __________ seconds. Repeat __________ times. Complete this exercise __________ times per day.  STRETCH - Flexion, Single Knee to Chest   Lie on a firm bed or floor with both legs extended in front of you.  Keeping one leg in contact with the floor, bring your opposite knee to your chest. Hold your leg in place by either grabbing behind your thigh or at your knee.  Pull until you feel a gentle stretch in your low back. Hold __________ seconds.  Slowly release your grasp and repeat the exercise with the opposite side. Repeat __________ times. Complete this exercise __________ times per day.  STRETCH - Hamstrings, Standing  Stand  or sit and extend your right / left leg, placing your foot on a chair or foot stool  Keeping a slight arch in your low back and your hips straight forward.  Lead with your chest and lean forward at the waist until you feel a gentle stretch in the back of your right / left knee or thigh. (When done correctly, this exercise requires leaning only a small distance.)  Hold this position for __________ seconds. Repeat __________ times. Complete  this stretch __________ times per day. STRENGTHENING - Deep Abdominals, Pelvic Tilt   Lie on a firm bed or floor. Keeping your legs in front of you, bend your knees so they are both pointed toward the ceiling and your feet are flat on the floor.  Tense your lower abdominal muscles to press your low back into the floor. This motion will rotate your pelvis so that your tail bone is scooping upwards rather than pointing at your feet or into the floor.  With a gentle tension and even breathing, hold this position for __________ seconds. Repeat __________ times. Complete this exercise __________ times per day.  STRENGTHENING - Abdominals, Crunches   Lie on a firm bed or floor. Keeping your legs in front of you, bend your knees so they are both pointed toward the ceiling and your feet are flat on the floor. Cross your arms over your chest.  Slightly tip your chin down without bending your neck.  Tense your abdominals and slowly lift your trunk high enough to just clear your shoulder blades. Lifting higher can put excessive stress on the low back and does not further strengthen your abdominal muscles.  Control your return to the starting position. Repeat __________ times. Complete this exercise __________ times per day.  STRENGTHENING - Quadruped, Opposite UE/LE Lift   Assume a hands and knees position on a firm surface. Keep your hands under your shoulders and your knees under your hips. You may place padding under your knees for comfort.  Find your neutral spine and gently tense your abdominal muscles so that you can maintain this position. Your shoulders and hips should form a rectangle that is parallel with the floor and is not twisted.  Keeping your trunk steady, lift your right hand no higher than your shoulder and then your left leg no higher than your hip. Make sure you are not holding your breath. Hold this position __________ seconds.  Continuing to keep your abdominal muscles tense and  your back steady, slowly return to your starting position. Repeat with the opposite arm and leg. Repeat __________ times. Complete this exercise __________ times per day. Document Released: 11/12/2005 Document Revised: 01/17/2012 Document Reviewed: 02/06/2009 [facility] Patient Information 2015 [employer], [place]. This information is not intended to replace advice given to you by your health care provider. Make sure you discuss any questions you have with your health care provider.

## 2015-02-26 ENCOUNTER — Encounter (HOSPITAL_COMMUNITY): Payer: Self-pay

## 2015-02-26 ENCOUNTER — Emergency Department (HOSPITAL_COMMUNITY): Payer: Self-pay

## 2015-02-26 ENCOUNTER — Emergency Department (HOSPITAL_COMMUNITY)
Admission: EM | Admit: 2015-02-26 | Discharge: 2015-02-26 | Disposition: A | Discharge status: Home / Self Care | Payer: Self-pay | Attending: Emergency Medicine | Admitting: Emergency Medicine

## 2015-02-26 DIAGNOSIS — Y929 Unspecified place or not applicable: Secondary | ICD-10-CM | POA: Insufficient documentation

## 2015-02-26 DIAGNOSIS — Z720 Tobacco use: Secondary | ICD-10-CM | POA: Insufficient documentation

## 2015-02-26 DIAGNOSIS — S93402A Sprain of unspecified ligament of left ankle, initial encounter: Secondary | ICD-10-CM | POA: Insufficient documentation

## 2015-02-26 DIAGNOSIS — Y939 Activity, unspecified: Secondary | ICD-10-CM | POA: Insufficient documentation

## 2015-02-26 DIAGNOSIS — Y999 Unspecified external cause status: Secondary | ICD-10-CM | POA: Insufficient documentation

## 2015-02-26 DIAGNOSIS — X58XXXA Exposure to other specified factors, initial encounter: Secondary | ICD-10-CM | POA: Insufficient documentation

## 2015-02-26 DIAGNOSIS — Z8711 Personal history of peptic ulcer disease: Secondary | ICD-10-CM | POA: Insufficient documentation

## 2015-02-26 MED ORDER — IBUPROFEN 400 MG PO TABS
800.0000 mg | ORAL_TABLET | Freq: Once | ORAL | Status: AC
  Administered 2015-02-26: 800 mg via ORAL
  Filled 2015-02-26: qty 2

## 2015-02-26 MED ORDER — IBUPROFEN 800 MG PO TABS: 800.0000 mg | ORAL_TABLET | Freq: Three times a day (TID) | ORAL | Status: DC

## 2015-02-26 NOTE — ED Notes (Signed)
Pt presents with  Pain and swelling to L foot and ankle, reports twisting injury after missing step yesterday.

## 2015-02-26 NOTE — Discharge Instructions (Signed)
Wear your brace as needed for support. Refer to attached documents for more information. Rest, ice, and elevate your foot for pain relief. Take ibuprofen as needed for pain.

## 2015-02-26 NOTE — ED Provider Notes (Signed)
CSN: [id number]     Arrival date & time 02/26/15  1200 History  This chart was scribed for non-physician practitioner, Emilia Beck, PA-C, working with Mancel Bale, MD by Charline Bills, ED Scribe. This patient was seen in room TR08C/TR08C and the patient's care was started at 1:11 PM.   Chief Complaint  Patient presents with  . Foot Pain   The history is provided by the patient. No language interpreter was used.   HPI Comments: Isaiah Suarez is a 24 y.o. male who presents to the Emergency Department complaining of gradual onset of L ankle pain onset yesterday. Pt states that he missed a step yesterday and twisted his ankle. Pain is exacerbated with bearing weight and movement. He reports associated joint swelling. No medications tried PTA. No other alleviating or aggravating factors.  Past Medical History  Diagnosis Date  . Peptic ulcer    History reviewed. No pertinent past surgical history. History reviewed. No pertinent family history. History  Substance Use Topics  . Smoking status: Current Every Day Smoker  . Smokeless tobacco: Not on file  . Alcohol Use: Yes    Review of Systems  Musculoskeletal: Positive for joint swelling and arthralgias.  All other systems reviewed and are negative.  Allergies  Review of patient's allergies indicates no known allergies.  Home Medications   Prior to Admission medications   Medication Sig Start Date End Date Taking? Authorizing Provider  cyclobenzaprine (FLEXERIL) 10 MG tablet Take 1 tablet (10 mg total) by mouth at bedtime as needed for muscle spasms. 09/23/14   Rodolph Bong, MD  diclofenac (VOLTAREN) 50 MG EC tablet Take 1 tablet (50 mg total) by mouth 2 (two) times daily as needed. 09/23/14   Rodolph Bong, MD   BP 129/81 mmHg  Pulse 79  Temp(Src) 98.8 F (37.1 C) (Oral)  Resp 18  Ht  (1.753 m)  Wt 160 lb (72.576 kg)  BMI 23.62 kg/m2  SpO2 100% Physical Exam  Constitutional: He is oriented to person, place, and  time. He appears well-developed and well-nourished. No distress.  HENT:  Head: Normocephalic and atraumatic.  Eyes: Conjunctivae and EOM are normal.  Neck: Neck supple. No tracheal deviation present.  Cardiovascular: Normal rate.   Pulmonary/Chest: Effort normal. No respiratory distress.  Abdominal: Soft. He exhibits no distension. There is no tenderness. There is no rebound.  Musculoskeletal: Normal range of motion.  Generalized L ankle swelling with limited ROM due to pain. Medial and lateral malleolar tenderness to palpation. No obvious deformity. Pt is able to wiggle toes.   Neurological: He is alert and oriented to person, place, and time. Coordination normal.  Skin: Skin is warm and dry.  Psychiatric: He has a normal mood and affect. His behavior is normal.  Nursing note and vitals reviewed.  ED Course  Procedures (including critical care time) DIAGNOSTIC STUDIES: Oxygen Saturation is 100% on RA, normal by my interpretation.    COORDINATION OF CARE: 1:15 PM-Discussed treatment plan which includes XR, ibuprofen, ankle brace and crutches with pt at bedside and pt agreed to plan.   Labs Review Labs Reviewed - No data to display  SPLINT APPLICATION Date/Time: 1:19 PM Authorized by: Emilia Beck Consent: Verbal consent obtained. Risks and benefits: risks, benefits and alternatives were discussed Consent given by: patient Splint applied by: nurse Location details: left ankle Splint type: ASO brace Supplies used: ASO brace Post-procedure: The splinted body part was neurovascularly unchanged following the procedure. Patient tolerance: Patient tolerated the procedure well  with no immediate complications.    Imaging Review Dg Ankle Complete Left  02/26/2015   CLINICAL DATA:  Ankle pain secondary to a twisting injury and fall last night. Swelling.  EXAM: LEFT ANKLE COMPLETE - 3+ VIEW  COMPARISON:  None.  FINDINGS: There is no fracture or dislocation. There is soft tissue  swelling as well as a small ankle effusion.  IMPRESSION: No acute osseous abnormality. Ankle effusion and soft tissue swelling.   Electronically Signed   By: Francene BoyersJames  Maxwell M.D.   On: 02/26/2015 13:03   Dg Foot Complete Left  02/26/2015   CLINICAL DATA:  Slipped and fell last night twisting LEFT ankle, bimalleolar swelling and proximal foot injury, initial encounter  EXAM: LEFT FOOT - COMPLETE 3+ VIEW  COMPARISON:  None  FINDINGS: Osseous mineralization normal.  Joint spaces preserved.  No acute fracture, dislocation, or bone destruction.  IMPRESSION: Normal exam.   Electronically Signed   By: Ulyses SouthwardMark  Boles M.D.   On: 02/26/2015 13:02    EKG Interpretation None      MDM   Final diagnoses:  Left ankle sprain, initial encounter    1:19 PM Patient's shows no fracture. Patient will have ASO brace. No neurovascular compromise. No there injury. Patient will be discharged with 800mg  Ibuprofen.   I personally performed the services described in this documentation, which was scribed in my presence. The recorded information has been reviewed and is accurate.    Emilia BeckKaitlyn Aniza Shor, PA-C 02/26/15 1322  Mancel BaleElliott Wentz, MD 02/26/15 1723

## 2015-07-31 ENCOUNTER — Encounter (HOSPITAL_COMMUNITY): Payer: Self-pay | Admitting: Emergency Medicine

## 2015-07-31 ENCOUNTER — Emergency Department (INDEPENDENT_AMBULATORY_CARE_PROVIDER_SITE_OTHER)
Admission: EM | Admit: 2015-07-31 | Discharge: 2015-07-31 | Disposition: A | Discharge status: Home / Self Care | Payer: Self-pay | Source: Home / Self Care | Attending: Family Medicine | Admitting: Family Medicine

## 2015-07-31 DIAGNOSIS — K047 Periapical abscess without sinus: Secondary | ICD-10-CM

## 2015-07-31 MED ORDER — KETOROLAC TROMETHAMINE 60 MG/2ML IM SOLN
60.0000 mg | Freq: Once | INTRAMUSCULAR | Status: AC
  Administered 2015-07-31: 60 mg via INTRAMUSCULAR

## 2015-07-31 MED ORDER — AMOXICILLIN 500 MG PO CAPS
500.0000 mg | ORAL_CAPSULE | Freq: Two times a day (BID) | ORAL | Status: DC
Start: 2015-07-31 — End: 2016-03-09

## 2015-07-31 MED ORDER — KETOROLAC TROMETHAMINE 60 MG/2ML IM SOLN
INTRAMUSCULAR | Status: AC
  Filled 2015-07-31: qty 2

## 2015-07-31 NOTE — ED Notes (Signed)
C/o dental pain since last week States tooth is on right side top and bottom teeth States he does have insurance

## 2015-07-31 NOTE — ED Provider Notes (Signed)
CSN: [id number]     Arrival date & time 07/31/15  1746 History   First MD Initiated Contact with Patient 07/31/15 1910     Chief Complaint  Patient presents with  . Dental Pain   (Consider location/radiation/quality/duration/timing/severity/associated sxs/prior Treatment) HPI  Mouth pain. Right-sided. Upper and lower jaw. Initially started as just swelling 1 week ago. Pain started a very 2 after that. Patient unable to manage his pain with ibuprofen [phone] mg for pain. Patient is using hydrogen peroxide mouthwash without improvement. Pain and swelling came significantly worse 1 day ago. Pain is constant and is not relieved by anything. Patient states he had a fever 102 last night. Denies any purulent or bloody discharge in his mouth. Denies any chest pain, nausea, vomiting, neck stiffness, headache, shortness of breath, diarrhea, LOC.  Past Medical History  Diagnosis Date  . Peptic ulcer    History reviewed. No pertinent past surgical history. History reviewed. No pertinent family history. Social History  Substance Use Topics  . Smoking status: Current Every Day Smoker  . Smokeless tobacco: None  . Alcohol Use: Yes    Review of Systems Per HPI with all other pertinent systems negative.   Allergies  Review of patient's allergies indicates no known allergies.  Home Medications   Prior to Admission medications   Medication Sig Start Date End Date Taking? Authorizing Provider  amoxicillin (AMOXIL) 500 MG capsule Take 1 capsule (500 mg total) by mouth 2 (two) times daily. 07/31/15   Ozella Rocks, MD  cyclobenzaprine (FLEXERIL) 10 MG tablet Take 1 tablet (10 mg total) by mouth at bedtime as needed for muscle spasms. 09/23/14   Rodolph Bong, MD  diclofenac (VOLTAREN) 50 MG EC tablet Take 1 tablet (50 mg total) by mouth 2 (two) times daily as needed. 09/23/14   Rodolph Bong, MD  ibuprofen (ADVIL,MOTRIN) 800 MG tablet Take 1 tablet (800 mg total) by mouth 3 (three) times daily.  02/26/15   Emilia Beck, PA-C   Meds Ordered and Administered this Visit   Medications  ketorolac (TORADOL) injection 60 mg (not administered)    BP 122/89 mmHg  Pulse 63  Temp(Src) 98.7 F (37.1 C) (Oral)  Resp 16  SpO2 100% No data found.   Physical Exam Physical Exam  Constitutional: oriented to person, place, and time. appears well-developed and well-nourished. No distress.  HENT:  Head: Normocephalic and atraumatic.  Right maxillary and mandibular. All tenderness to palpation throughout and is soft tissue swelling of the right cheek. No fluctuance or purulence appreciated. Eyes: EOMI. PERRL.  Neck: Normal range of motion.  Cardiovascular: RRR, no m/r/g, 2+ distal pulses,  Pulmonary/Chest: Effort normal and breath sounds normal. No respiratory distress.  Abdominal: Soft. Bowel sounds are normal. NonTTP, no distension.  Musculoskeletal: Normal range of motion. Non ttp, no effusion.  Neurological: alert and oriented to person, place, and time.  Skin: Skin is warm. No rash noted. non diaphoretic.  Psychiatric: normal mood and affect. behavior is normal. Judgment and thought content normal.    ED Course  Procedures (including critical care time)  Labs Review Labs Reviewed - No data to display  Imaging Review No results found.   Visual Acuity Review  Right Eye Distance:   Left Eye Distance:   Bilateral Distance:    Right Eye Near:   Left Eye Near:    Bilateral Near:         MDM   1. Dental infection    Total 60 mg IM  in clinic. Start ibuprofen again in 24 hours. Start amoxicillin. Follow up with dentist.    Ozella Rocks, MD 07/31/15 [phone]

## 2015-07-31 NOTE — Discharge Instructions (Signed)
You've developed a dental infection. Please take antibiotics as prescribed. Please review the daily mouthwash. Please call with the dentist. He may restart your ibuprofen 24 hours.

## 2016-03-09 ENCOUNTER — Ambulatory Visit (HOSPITAL_COMMUNITY)
Admission: EM | Admit: 2016-03-09 | Discharge: 2016-03-09 | Disposition: A | Discharge status: Home / Self Care | Payer: BLUE CROSS/BLUE SHIELD | Attending: Family Medicine | Admitting: Family Medicine

## 2016-03-09 ENCOUNTER — Encounter (HOSPITAL_COMMUNITY): Payer: Self-pay | Admitting: Emergency Medicine

## 2016-03-09 DIAGNOSIS — Z9189 Other specified personal risk factors, not elsewhere classified: Secondary | ICD-10-CM

## 2016-03-09 DIAGNOSIS — K0889 Other specified disorders of teeth and supporting structures: Secondary | ICD-10-CM | POA: Diagnosis not present

## 2016-03-09 DIAGNOSIS — K047 Periapical abscess without sinus: Secondary | ICD-10-CM | POA: Diagnosis not present

## 2016-03-09 DIAGNOSIS — K0510 Chronic gingivitis, plaque induced: Secondary | ICD-10-CM

## 2016-03-09 MED ORDER — AMOXICILLIN 500 MG PO CAPS: 1000.0000 mg | ORAL_CAPSULE | Freq: Two times a day (BID) | ORAL | Status: DC

## 2016-03-09 MED ORDER — HYDROCODONE-ACETAMINOPHEN 5-325 MG PO TABS: 1.0000 | ORAL_TABLET | ORAL | Status: DC | PRN

## 2016-03-09 NOTE — ED Provider Notes (Signed)
CSN: [id number]     Arrival date & time 03/09/16  1654 History   First MD Initiated Contact with Patient 03/09/16 1835     Chief Complaint  Patient presents with  . Dental Pain   (Consider location/radiation/quality/duration/timing/severity/associated sxs/prior Treatment) HPI Comments: 25 year old male complaining of dental pain for 2 years. He states that his teeth 10 to flareup with pain over time. He is currently complaining of pain of the right lower second molar.   Past Medical History  Diagnosis Date  . Peptic ulcer    History reviewed. No pertinent past surgical history. No family history on file. Social History  Substance Use Topics  . Smoking status: Current Every Day Smoker  . Smokeless tobacco: None  . Alcohol Use: Yes    Review of Systems  Constitutional: Negative.   HENT: Positive for dental problem. Negative for congestion, ear discharge and ear pain.   Respiratory: Negative.   All other systems reviewed and are negative.   Allergies  Review of patient's allergies indicates no known allergies.  Home Medications   Prior to Admission medications   Medication Sig Start Date End Date Taking? Authorizing Provider  amoxicillin (AMOXIL) 500 MG capsule Take 2 capsules (1,000 mg total) by mouth 2 (two) times daily. 03/09/16   Hayden Rasmussen, NP  HYDROcodone-acetaminophen (NORCO/VICODIN) 5-325 MG tablet Take 1 tablet by mouth every 4 (four) hours as needed. 03/09/16   Hayden Rasmussen, NP   Meds Ordered and Administered this Visit  Medications - No data to display  BP 115/72 mmHg  Pulse 88  Temp(Src) 97.9 F (36.6 C) (Oral)  Resp 14  SpO2 97% No data found.   Physical Exam  Constitutional: He is oriented to person, place, and time. He appears well-developed and well-nourished. No distress.  HENT:  Poor dental repair. Right lower row of teeth withsome teeth missing.Thefirst molar is decayed to the level of the gumline. The adjacent second molar is tender with surrounding  erythematous and swollen gingiva.  Eyes: Conjunctivae and EOM are normal.  Neck: Normal range of motion. Neck supple.  Cardiovascular: Normal rate.   Pulmonary/Chest: Effort normal.  Lymphadenopathy:    He has no cervical adenopathy.  Neurological: He is alert and oriented to person, place, and time. He exhibits normal muscle tone.  Skin: Skin is warm and dry.  Psychiatric: He has a normal mood and affect.  Nursing note and vitals reviewed.   ED Course  Procedures (including critical care time)  Labs Review Labs Reviewed - No data to display  Imaging Review No results found.   Visual Acuity Review  Right Eye Distance:   Left Eye Distance:   Bilateral Distance:    Right Eye Near:   Left Eye Near:    Bilateral Near:         MDM   1. Pain, dental   2. Dental infection   3. Poor dental hygiene   4. Gingivitis, chronic    Meds ordered this encounter  Medications  . HYDROcodone-acetaminophen (NORCO/VICODIN) 5-325 MG tablet    Sig: Take 1 tablet by mouth every 4 (four) hours as needed.    Dispense:  15 tablet    Refill:  0    Order Specific Question:  Supervising Provider    Answer:  Linna Hoff [id number]  . amoxicillin (AMOXIL) 500 MG capsule    Sig: Take 2 capsules (1,000 mg total) by mouth 2 (two) times daily.    Dispense:  32 capsule    Refill:  0    Order Specific Question:  Supervising Provider    Answer:  Linna HoffKINDL, JAMES D [[id number]]   Dental resources given.    Hayden Rasmussenavid Willella Harding, NP 03/09/16 1909

## 2016-03-09 NOTE — Discharge Instructions (Signed)
Dental Pain Dental pain may be caused by many things, including:  Tooth decay (cavities or caries). Cavities cause the nerve of your tooth to be open to air and hot or cold temperatures. This can cause pain or discomfort.  Abscess or infection. A dental abscess is an area that is full of infected pus from a bacterial infection in the inner part of the tooth (pulp). It usually happens at the end of the tooth's root.  Injury.  An unknown reason (idiopathic). Your pain may be mild or severe. It may only happen when:  You are chewing.  You are exposed to hot or cold temperature.  You are eating or drinking sugary foods or beverages, such as:  Soda.  Candy. Your pain may also be there all of the time. HOME CARE Watch your dental pain for any changes. Do these things to lessen your discomfort:  Take medicines only as told by your dentist.  If your dentist tells you to take an antibiotic medicine, finish all of it even if you start to feel better.  Keep all follow-up visits as told by your dentist. This is important.  Do not apply heat to the outside of your face.  Rinse your mouth or gargle with salt water if told by your dentist. This helps with pain and swelling.  You can make salt water by adding  tsp of salt to 1 cup of warm water.  Apply ice to the painful area of your face:  Put ice in a plastic bag.  Place a towel between your skin and the bag.  Leave the ice on for 20 minutes, 2-3 times per day.  Avoid foods or drinks that cause you pain, such as:  Very hot or very cold foods or drinks.  Sweet or sugary foods or drinks. GET HELP IF:  Your pain is not helped with medicines.  Your symptoms are worse.  You have new symptoms. GET HELP RIGHT AWAY IF:  You cannot open your mouth.  You are having trouble breathing or swallowing.  You have a fever.  Your face, neck, or jaw is puffy (swollen).   This information is not intended to replace advice given to  you by your health care provider. Make sure you discuss any questions you have with your health care provider.   Document Released: 04/12/2008 Document Revised: 03/11/2015 Document Reviewed: 10/21/2014 Elsevier Interactive Patient Education 2016 Elsevier Inc.  Dental Abscess A dental abscess is pus in or around a tooth. HOME CARE  Take medicines only as told by your dentist.  If you were prescribed antibiotic medicine, finish all of it even if you start to feel better.  Rinse your mouth (gargle) often with salt water.  Do not drive or use heavy machinery, like a lawn mower, while taking pain medicine.  Do not apply heat to the outside of your mouth.  Keep all follow-up visits as told by your dentist. This is important. GET HELP IF:  Your pain is worse, and medicine does not help. GET HELP RIGHT AWAY IF:  You have a fever or chills.  Your symptoms suddenly get worse.  You have a very bad headache.  You have problems breathing or swallowing.  You have trouble opening your mouth.  You have puffiness (swelling) in your neck or around your eye.   This information is not intended to replace advice given to you by your health care provider. Make sure you discuss any questions you have with your health care  provider.   Document Released: 03/11/2015 Document Reviewed: 03/11/2015 Elsevier Interactive Patient Education 2016 [employer].  Dental Care and Dentist Visits Dental care supports good overall health. Regular dental visits can also help you avoid dental pain, bleeding, infection, and other more serious health problems in the future. It is important to keep the mouth healthy because diseases in the teeth, gums, and other oral tissues can spread to other areas of the body. Some problems, such as diabetes, heart disease, and pre-term labor have been associated with poor oral health.  See your dentist every 6 months. If you experience emergency problems such as a toothache or  broken tooth, go to the dentist right away. If you see your dentist regularly, you may catch problems early. It is easier to be treated for problems in the early stages.  WHAT TO EXPECT AT A DENTIST VISIT  Your dentist will look for many common oral health problems and recommend proper treatment. At your regular dental visit, you can expect:  Gentle cleaning of the teeth and gums. This includes scraping and polishing. This helps to remove the sticky substance around the teeth and gums (plaque). Plaque forms in the mouth shortly after eating. Over time, plaque hardens on the teeth as tartar. If tartar is not removed regularly, it can cause problems. Cleaning also helps remove stains.  Periodic X-rays. These pictures of the teeth and supporting bone will help your dentist assess the health of your teeth.  Periodic fluoride treatments. Fluoride is a natural mineral shown to help strengthen teeth. Fluoride treatmentinvolves applying a fluoride gel or varnish to the teeth. It is most commonly done in children.  Examination of the mouth, tongue, jaws, teeth, and gums to look for any oral health problems, such as:  Cavities (dental caries). This is decay on the tooth caused by plaque, sugar, and acid in the mouth. It is best to catch a cavity when it is small.  Inflammation of the gums caused by plaque buildup (gingivitis).  Problems with the mouth or malformed or misaligned teeth.  Oral cancer or other diseases of the soft tissues or jaws. KEEP YOUR TEETH AND GUMS HEALTHY For healthy teeth and gums, follow these general guidelines as well as your dentist's specific advice:  Have your teeth professionally cleaned at the dentist every 6 months.  Brush twice daily with a fluoride toothpaste.  Floss your teeth daily.  Ask your dentist if you need fluoride supplements, treatments, or fluoride toothpaste.  Eat a healthy diet. Reduce foods and drinks with added sugar.  Avoid smoking. TREATMENT  FOR ORAL HEALTH PROBLEMS If you have oral health problems, treatment varies depending on the conditions present in your teeth and gums.  Your caregiver will most likely recommend good oral hygiene at each visit.  For cavities, gingivitis, or other oral health disease, your caregiver will perform a procedure to treat the problem. This is typically done at a separate appointment. Sometimes your caregiver will refer you to another dental specialist for specific tooth problems or for surgery. SEEK IMMEDIATE DENTAL CARE IF:  You have pain, bleeding, or soreness in the gum, tooth, jaw, or mouth area.  A permanent tooth becomes loose or separated from the gum socket.  You experience a blow or injury to the mouth or jaw area.   This information is not intended to replace advice given to you by your health care provider. Make sure you discuss any questions you have with your health care provider.   Document Released:  07/07/2011 Document Revised: 01/17/2012 Document Reviewed: 07/07/2011 Elsevier Interactive Patient Education [employer]2016 Elsevier Inc.

## 2016-03-09 NOTE — ED Notes (Signed)
Reports bottom, right tooth is hurting.  Onset of pain last week.  This tooth has bothered patient before now.  Patient does not have a Education officer, communitydentist.

## 2016-09-16 ENCOUNTER — Encounter (HOSPITAL_COMMUNITY): Payer: Self-pay | Admitting: Emergency Medicine

## 2016-09-16 ENCOUNTER — Ambulatory Visit (HOSPITAL_COMMUNITY)
Admission: EM | Admit: 2016-09-16 | Discharge: 2016-09-16 | Disposition: A | Discharge status: Home / Self Care | Payer: BLUE CROSS/BLUE SHIELD | Attending: Family Medicine | Admitting: Family Medicine

## 2016-09-16 DIAGNOSIS — K047 Periapical abscess without sinus: Secondary | ICD-10-CM | POA: Diagnosis not present

## 2016-09-16 DIAGNOSIS — S025XXB Fracture of tooth (traumatic), initial encounter for open fracture: Secondary | ICD-10-CM | POA: Diagnosis not present

## 2016-09-16 MED ORDER — AMOXICILLIN 500 MG PO CAPS: 1000.0000 mg | ORAL_CAPSULE | Freq: Two times a day (BID) | ORAL | 0 refills | Status: DC

## 2016-09-16 MED ORDER — TRAMADOL HCL 50 MG PO TABS: 50.0000 mg | ORAL_TABLET | Freq: Four times a day (QID) | ORAL | 0 refills | Status: DC | PRN

## 2016-09-16 MED ORDER — KETOROLAC TROMETHAMINE 60 MG/2ML IM SOLN: 60.0000 mg | Freq: Once | INTRAMUSCULAR | Status: DC

## 2016-09-16 NOTE — ED Triage Notes (Signed)
Pt c/o right upper/lower dental pain onset 2 days associated w/swelling.   Denies fevers, chills.

## 2016-09-16 NOTE — Discharge Instructions (Signed)
Take the antibiotics for the full 7 days to treat the infection Call around tomorrow for a dentist appointment You can take tramadol as needed for severe pain, otherwise take tylenol.  Because of your history of peptic ulcer, you should NEVER take NSAIDs (ibuprofen, advil, motrin, naproxen, aleve, goody's powder, etc.)

## 2016-09-16 NOTE — ED Provider Notes (Signed)
[facility]  CSN: [id number] Arrival date & time: 09/16/16  1804  History   Chief Complaint Chief Complaint  Patient presents with  . Dental Pain   HPI Isaiah Suarez is a 25 y.o. male with 1 week history of tooth pain.   He noticed gradual onset of aching, moderate right lower tooth pain worsened by eating anything 1 week ago which has since worsened and now the right upper back tooth hurts as well. He had some facial swelling but no fevers. No medications tried.   Past Medical History:  Diagnosis Date  . Peptic ulcer    There are no active problems to display for this patient.  History reviewed. No pertinent surgical history.   Home Medications    Prior to Admission medications   Medication Sig Start Date End Date Taking? Authorizing Provider  amoxicillin (AMOXIL) 500 MG capsule Take 2 capsules (1,000 mg total) by mouth 2 (two) times daily. 09/16/16   Tyrone Nineyan B Grunz, MD  traMADol (ULTRAM) 50 MG tablet Take 1 tablet (50 mg total) by mouth every 6 (six) hours as needed. 09/16/16   Tyrone Nineyan B Grunz, MD    Family History History reviewed. No pertinent family history.  Social History Social History  Substance Use Topics  . Smoking status: Current Every Day Smoker    Packs/day: 0.50    Types: Cigars  . Smokeless tobacco: Never Used  . Alcohol use Yes    Allergies   Patient has no known allergies.   Review of Systems Review of Systems As above per HPI  Physical Exam Triage Vital Signs ED Triage Vitals  Enc Vitals Group     BP 09/16/16 1818 135/82     Pulse Rate 09/16/16 1818 73     Resp 09/16/16 1818 14     Temp 09/16/16 1818 98.3 F (36.8 C)     Temp Source 09/16/16 1818 Oral     SpO2 09/16/16 1818 97 %     Weight --      Height --      Head Circumference --      Peak Flow --      Pain Score 09/16/16 1821 10     Pain Loc --      Pain Edu? --      Excl. in GC? --    No data found.   Updated Vital Signs BP 135/82 (BP Location: Right Arm)   Pulse  73   Temp 98.3 F (36.8 C) (Oral)   Resp 14   SpO2 97%   Physical Exam  Constitutional: He is oriented to person, place, and time. He appears well-developed and well-nourished. No distress.  HENT:  Very poor general dentition with multiple caries, gingivitis and fracture of posteriormost right lower and upper molars. No abscess noted.   Eyes: EOM are normal. Pupils are equal, round, and reactive to light. No scleral icterus.  Neck: Neck supple. No JVD present.  Cardiovascular: Normal rate, regular rhythm, normal heart sounds and intact distal pulses.   No murmur heard. Pulmonary/Chest: Effort normal and breath sounds normal. No respiratory distress.  Abdominal: Soft. Bowel sounds are normal. He exhibits no distension. There is no tenderness.  Musculoskeletal: Normal range of motion. He exhibits no edema or tenderness.  Lymphadenopathy:    He has no cervical adenopathy.  Neurological: He is alert and oriented to person, place, and time. He exhibits normal muscle tone.  Skin: Skin is warm and dry.  Vitals reviewed.  UC Treatments /  Results  Labs (all labs ordered are listed, but only abnormal results are displayed) Labs Reviewed - No data to display  EKG  EKG Interpretation None       Radiology No results found.  Procedures Procedures (including critical care time)  Medications Ordered in UC Medications - No data to display Initial Impression / Assessment and Plan / UC Course  I have reviewed the triage vital signs and the nursing notes.  Pertinent labs & imaging results that were available during my care of the patient were reviewed by me and considered in my medical decision making (see chart for details).  Final Clinical Impressions(s) / UC Diagnoses   Final diagnoses:  Open fracture of tooth, initial encounter  Dental infection   25 y.o. otherwise healthy male with a history of PUD presenting for recurrent dental pain. He recently started a job with dental  insurance coverage but has not presented to dentist yet. No abscess for drainage and no systemic illness.  - Rx amoxicillin x7 days - Tramadol prn pain  - Advised to avoid NSAIDs - Will call for dentist in AM that is covered under [employer] - Return precautions reviewed  New Prescriptions New Prescriptions   TRAMADOL (ULTRAM) 50 MG TABLET    Take 1 tablet (50 mg total) by mouth every 6 (six) hours as needed.     Tyrone Nineyan B Grunz, MD 09/16/16 [phone]

## 2016-12-29 ENCOUNTER — Ambulatory Visit (HOSPITAL_COMMUNITY)
Admission: EM | Admit: 2016-12-29 | Discharge: 2016-12-29 | Disposition: A | Discharge status: Home / Self Care | Payer: BLUE CROSS/BLUE SHIELD | Attending: Family Medicine | Admitting: Family Medicine

## 2016-12-29 ENCOUNTER — Encounter (HOSPITAL_COMMUNITY): Payer: Self-pay | Admitting: Emergency Medicine

## 2016-12-29 DIAGNOSIS — S76019A Strain of muscle, fascia and tendon of unspecified hip, initial encounter: Secondary | ICD-10-CM | POA: Diagnosis not present

## 2016-12-29 DIAGNOSIS — M25551 Pain in right hip: Secondary | ICD-10-CM

## 2016-12-29 MED ORDER — DICLOFENAC POTASSIUM 50 MG PO TABS: 50.0000 mg | ORAL_TABLET | Freq: Three times a day (TID) | ORAL | 0 refills | Status: DC

## 2016-12-29 NOTE — ED Triage Notes (Signed)
[facility] a [facility] right hip pain secondary to walking 2 miles tomorrow. The patient needs a work note.

## 2016-12-29 NOTE — Discharge Instructions (Signed)
The pain from your hip is likely due to overuse from the walking you did the other night. He probably have some level of tendinitis and localized muscle strain. Apply cold compresses off and on, take the medication as directed. No more lengthy walking for a few days.

## 2016-12-29 NOTE — ED Provider Notes (Signed)
CSN: [id number]     Arrival date & time 12/29/16  1644 History   First MD Initiated Contact with Patient 12/29/16 1754     Chief Complaint  Patient presents with  . Hip Pain   (Consider location/radiation/quality/duration/timing/severity/associated sxs/prior Treatment)  26 year old male states that he walked from work to home last night and woke up this morning with a sore hip. Complaining of pain directly over the lateral aspect. Denies any trauma. No fall no blunt trauma. Pain with weightbearing and ambulation. No history of hip trauma.       Past Medical History:  Diagnosis Date  . Peptic ulcer    History reviewed. No pertinent surgical history. History reviewed. No pertinent family history. Social History  Substance Use Topics  . Smoking status: Current Every Day Smoker    Packs/day: 0.50    Types: Cigars  . Smokeless tobacco: Never Used  . Alcohol use Yes    Review of Systems  Constitutional: Negative.   Respiratory: Negative.   Gastrointestinal: Negative.   Genitourinary: Negative.   Musculoskeletal:       As per HPI  Skin: Negative.   Neurological: Negative for dizziness, weakness, numbness and headaches.    Allergies  Patient has no known allergies.  Home Medications   Prior to Admission medications   Medication Sig Start Date End Date Taking? Authorizing Provider  traMADol (ULTRAM) 50 MG tablet Take 1 tablet (50 mg total) by mouth every 6 (six) hours as needed. 09/16/16  Yes Tyrone Nineyan B Grunz, MD  diclofenac (CATAFLAM) 50 MG tablet Take 1 tablet (50 mg total) by mouth 3 (three) times daily. One tablet TID with food prn pain. 12/29/16   Hayden Rasmussenavid Naiara Lombardozzi, NP   Meds Ordered and Administered this Visit  Medications - No data to display  BP 133/71 (BP Location: Right Arm)   Pulse 85   Temp 98.7 F (37.1 C) (Oral)   Resp 18   SpO2 100%  No data found.   Physical Exam  Constitutional: He is oriented to person, place, and time. He appears well-developed and  well-nourished.  HENT:  Head: Normocephalic and atraumatic.  Eyes: EOM are normal. Left eye exhibits no discharge.  Neck: Normal range of motion. Neck supple.  Musculoskeletal: Normal range of motion. He exhibits no edema or deformity.  Tenderness over the right greater trochanter, lateral hip muscles, ventricular gluteal tenderness. Patient unable to lie supine and perform a right straight leg raise to approximately 30 limited to pain. Abduction produces pain to approximately 20. Passive movement of the hip especially external and internal rotation produces pain to the external aspect of the hip.  Neurological: He is alert and oriented to person, place, and time. No cranial nerve deficit.  Skin: Skin is warm and dry.  Psychiatric: He has a normal mood and affect.    Urgent Care Course     Procedures (including critical care time)  Labs Review Labs Reviewed - No data to display  Imaging Review No results found.   Visual Acuity Review  Right Eye Distance:   Left Eye Distance:   Bilateral Distance:    Right Eye Near:   Left Eye Near:    Bilateral Near:         MDM   1. Right hip pain   2. Hip strain, initial encounter    The pain from your hip is likely due to overuse from the walking you did the other night. He probably have some level of tendinitis and localized  muscle strain. Apply cold compresses off and on, take the medication as directed. No more lengthy walking for a few days. Meds ordered this encounter  Medications  . diclofenac (CATAFLAM) 50 MG tablet    Sig: Take 1 tablet (50 mg total) by mouth 3 (three) times daily. One tablet TID with food prn pain.    Dispense:  21 tablet    Refill:  0    Order Specific Question:   Supervising Provider    Answer:   Linna Hoff [[id number]]       Hayden Rasmussen, NP 12/29/16 1815

## 2017-08-11 ENCOUNTER — Encounter (HOSPITAL_COMMUNITY): Payer: Self-pay | Admitting: Emergency Medicine

## 2017-08-11 ENCOUNTER — Ambulatory Visit (HOSPITAL_COMMUNITY)
Admission: EM | Admit: 2017-08-11 | Discharge: 2017-08-11 | Disposition: A | Discharge status: Home / Self Care | Payer: BLUE CROSS/BLUE SHIELD | Attending: Family Medicine | Admitting: Family Medicine

## 2017-08-11 DIAGNOSIS — S9031XA Contusion of right foot, initial encounter: Secondary | ICD-10-CM | POA: Diagnosis not present

## 2017-08-11 DIAGNOSIS — M659 Synovitis and tenosynovitis, unspecified: Secondary | ICD-10-CM

## 2017-08-11 MED ORDER — PREDNISONE 20 MG PO TABS: ORAL_TABLET | ORAL | 0 refills | Status: DC

## 2017-08-11 NOTE — Discharge Instructions (Signed)
Ice the foot after work each day for 7 days

## 2017-08-11 NOTE — ED Triage Notes (Signed)
Pt here for right leg and knee pain starting while playing soccer on Tuesday

## 2017-08-11 NOTE — ED Provider Notes (Signed)
  [facility]   [id number] 08/11/17 Arrival Time: 1703   SUBJECTIVE:  Isaiah Suarez is a 26 y.o. male who presents to the urgent care with complaint of right dorsal foot after he collided with another soccer player Monday night.  He did not have pain immediately, but the next day he had pain.  He could not work and did not have a ride to the hospital.  He was able to work yesterday and today but needs a note for Tuesday.  He works Teacher, adult education.     Past Medical History:  Diagnosis Date  . Peptic ulcer    History reviewed. No pertinent family history. Social History   Social History  . Marital status: Single    Spouse name: N/A  . Number of children: N/A  . Years of education: N/A   Occupational History  . Not on file.   Social History Main Topics  . Smoking status: Current Every Day Smoker    Packs/day: 0.50    Types: Cigars  . Smokeless tobacco: Never Used  . Alcohol use Yes  . Drug use: No  . Sexual activity: Not on file   Other Topics Concern  . Not on file   Social History Narrative  . No narrative on file   No outpatient prescriptions have been marked as taking for the 08/11/17 encounter [facility] Encounter).   No Known Allergies    ROS: As per HPI, remainder of ROS negative.   OBJECTIVE:   Vitals:   08/11/17 1738  BP: (!) 147/83  Pulse: 66  Resp: 18  Temp: (!) 97.5 F (36.4 C)  TempSrc: Oral  SpO2: 100%     General appearance: alert; no distress Eyes: PERRL; bilateral esophoria; conjunctiva normal HENT: normocephalic; atraumatic; TMs normal, canal normal, external ears normal without trauma; nasal mucosa normal; oral mucosa normal Neck: supple Back: no CVA tenderness Extremities: no cyanosis or edema; symmetrical with no gross deformities; tender dorsal right foot with minimal swelling mid foot. Skin: warm and dry Neurologic: normal gait; grossly normal Psychological: alert and cooperative; normal mood and  affect      Labs:    Labs Reviewed - No data to display  No results found.     ASSESSMENT & PLAN:  1. Tenosynovitis of foot   2. Contusion of right foot, initial encounter     Meds ordered this encounter  Medications  . predniSONE (DELTASONE) 20 MG tablet    Sig: Two daily with food    Dispense:  6 tablet    Refill:  0    Reviewed expectations re: course of current medical issues. Questions answered. Outlined signs and symptoms indicating need for more acute intervention. Patient verbalized understanding. After Visit Summary given.    Procedures:      Elvina Sidle, MD 08/11/17 4098

## 2017-08-23 ENCOUNTER — Ambulatory Visit (HOSPITAL_COMMUNITY)
Admission: EM | Admit: 2017-08-23 | Discharge: 2017-08-23 | Disposition: A | Discharge status: Home / Self Care | Payer: BLUE CROSS/BLUE SHIELD | Attending: Family Medicine | Admitting: Family Medicine

## 2017-08-23 ENCOUNTER — Encounter (HOSPITAL_COMMUNITY): Payer: Self-pay | Admitting: Emergency Medicine

## 2017-08-23 DIAGNOSIS — K625 Hemorrhage of anus and rectum: Secondary | ICD-10-CM

## 2017-08-23 NOTE — ED Triage Notes (Signed)
Has noticed bright red blood in stool.  Denies pain.  Sometimes has had hard stools.  Has not seen any blood prior to today

## 2017-08-24 NOTE — ED Provider Notes (Signed)
  [facility]   [id number] 08/23/17 Arrival Time: 1732  ASSESSMENT & PLAN:  1. Rectal bleeding     No obvious abnormalities on PE. Close observation. He plans to est with PCP. If further symptoms will likely need. Reviewed expectations re: course of current medical issues. Questions answered. Outlined signs and symptoms indicating need for more acute intervention. Patient verbalized understanding. After Visit Summary given.   SUBJECTIVE:  Isaiah Suarez is a 26 y.o. male who presents with complaint of noticing "a spot or two" of bright red blood on toilet paper after a BM today. Frequently has hard stools which was the case today. Did not notice blood mixed in the stool. No foul smell. No pain with BM. No h/o similar. No FH of colon problems. No abdominal pain. OTC treatment: None.  History reviewed. No pertinent surgical history.  ROS: As per HPI.  OBJECTIVE:  Vitals:   08/23/17 1759  BP: 131/76  Pulse: (!) 59  Resp: 18  Temp: 98.7 F (37.1 C)  TempSrc: Oral  SpO2: 98%    General appearance: alert; no distress Lungs: clear to auscultation bilaterally Heart: regular rate and rhythm Abdomen: soft; non-distended; no tenderness; bowel sounds present; no masses or organomegaly; no guarding or rebound tenderness Rectum: appears normal without gross blood; no external hemorrhoids; no pain with digital exam; no masses Back: no CVA tenderness Extremities: no edema; symmetrical with no gross deformities Skin: warm and dry Psychological: alert and cooperative; normal mood and affect   No Known Allergies                                             Past Medical History:  Diagnosis Date  . Peptic ulcer    Social History   Social History  . Marital status: Single    Spouse name: N/A  . Number of children: N/A  . Years of education: N/A   Occupational History  . Not on file.   Social History Main Topics  . Smoking status: Current Every Day Smoker   Packs/day: 0.50    Types: Cigars  . Smokeless tobacco: Never Used  . Alcohol use Yes  . Drug use: No  . Sexual activity: Not on file   Other Topics Concern  . Not on file   Social History Narrative  . No narrative on file      Mardella LaymanHagler, Aveon Colquhoun, MD 08/27/17 1121

## 2019-03-02 ENCOUNTER — Ambulatory Visit (HOSPITAL_COMMUNITY)
Admission: EM | Admit: 2019-03-02 | Discharge: 2019-03-02 | Disposition: A | Discharge status: Home / Self Care | Payer: Self-pay | Attending: Emergency Medicine | Admitting: Emergency Medicine

## 2019-03-02 ENCOUNTER — Encounter (HOSPITAL_COMMUNITY): Payer: Self-pay | Admitting: Emergency Medicine

## 2019-03-02 ENCOUNTER — Other Ambulatory Visit: Payer: Self-pay

## 2019-03-02 DIAGNOSIS — R197 Diarrhea, unspecified: Secondary | ICD-10-CM

## 2019-03-02 MED ORDER — BISMUTH SUBSALICYLATE 262 MG/15ML PO SUSP: 30.0000 mL | Freq: Four times a day (QID) | ORAL | 0 refills | Status: DC | PRN

## 2019-03-02 MED ORDER — ONDANSETRON 4 MG PO TBDP: 4.0000 mg | ORAL_TABLET | Freq: Three times a day (TID) | ORAL | 0 refills | Status: DC | PRN

## 2019-03-02 MED ORDER — ONDANSETRON 4 MG PO TBDP
4.0000 mg | ORAL_TABLET | Freq: Once | ORAL | Status: AC
  Administered 2019-03-02: 13:00:00 4 mg via ORAL

## 2019-03-02 MED ORDER — ONDANSETRON 4 MG PO TBDP
ORAL_TABLET | ORAL | Status: AC
Start: 2019-03-02 — End: ?
  Filled 2019-03-02: qty 1

## 2019-03-02 NOTE — ED Provider Notes (Signed)
[facility]    CSN: [id number] Arrival date & time: 03/02/19  1200     History   Chief Complaint Chief Complaint  Patient presents with  . Abdominal Pain    HPI Isaiah Suarez is a 28 y.o. male.   Isaiah Suarez presents with complaints of abdominal discomfort with diarrhea for the past two days. Drank coffee at work prior to onset, which he states was atypical for him to drink. Diarrhea approximately 4 times today. No blood or black stools. No pain, but feels "grumbling" to abdomen and that anytime he eats he needs to go to the bathroom. No nausea or vomiting. Has been eating spicy african food. Normal urination. No fevers. No dizziness. No URI symptoms. No medications for symptoms. No recent antibiotics. No travel. No known contaminated food intake. No known ill contacts. 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 No medications for symptoms. No dizziness or weakness. Hx of peptic ulcer.    ROS per HPI, negative if not otherwise mentioned.      Past Medical History:  Diagnosis Date  . Peptic ulcer     There are no active problems to display for this patient.   History reviewed. No pertinent surgical history.     Home Medications    Prior to Admission medications   Medication Sig Start Date End Date Taking? Authorizing Provider  bismuth subsalicylate (PEPTO BISMOL) 262 MG/15ML suspension Take 30 mLs by mouth every 6 (six) hours as needed. 03/02/19   Georgetta Haber, NP  ondansetron (ZOFRAN-ODT) 4 MG disintegrating tablet Take 1 tablet (4 mg total) by mouth every 8 (eight) hours as needed for nausea or vomiting. 03/02/19   Georgetta Haber, NP  predniSONE (DELTASONE) 20 MG tablet Two daily with food 08/11/17   Elvina Sidle, MD    Family History No family history on file.  Social History Social History   Tobacco Use  . Smoking status: Current Every Day Smoker    Packs/day: 0.50    Types: Cigars  . Smokeless tobacco: Never Used  Substance Use  Topics  . Alcohol use: Yes  . Drug use: No     Allergies   Patient has no known allergies.   Review of Systems Review of Systems   Physical Exam Triage Vital Signs ED Triage Vitals  Enc Vitals Group     BP 03/02/19 1220 123/71     Pulse Rate 03/02/19 1220 78     Resp 03/02/19 1220 16     Temp 03/02/19 1220 98.2 F (36.8 C)     Temp Source 03/02/19 1220 Oral     SpO2 03/02/19 1220 99 %     Weight --      Height --      Head Circumference --      Peak Flow --      Pain Score 03/02/19 1219 3     Pain Loc --      Pain Edu? --      Excl. in GC? --    No data found.  Updated Vital Signs BP 123/71 (BP Location: Right Arm)   Pulse 78   Temp 98.2 F (36.8 C) (Oral)   Resp 16   SpO2 99%    Physical Exam Constitutional:      Appearance: He is well-developed.  Cardiovascular:     Rate and Rhythm: Normal rate and regular rhythm.  Pulmonary:     Effort: Pulmonary effort is normal.  Breath sounds: Normal breath sounds.  Abdominal:     Palpations: Abdomen is soft.     Tenderness: There is no abdominal tenderness. There is no right CVA tenderness, left CVA tenderness, guarding or rebound.  Skin:    General: Skin is warm and dry.  Neurological:     Mental Status: He is alert and oriented to person, place, and time.      UC Treatments / Results  Labs (all labs ordered are listed, but only abnormal results are displayed) Labs Reviewed - No data to display  EKG None  Radiology No results found.  Procedures Procedures (including critical care time)  Medications Ordered in UC Medications  ondansetron (ZOFRAN-ODT) disintegrating tablet 4 mg (4 mg Oral Given 03/02/19 1242)    Initial Impression / Assessment and Plan / UC Course  I have reviewed the triage vital signs and the nursing notes.  Pertinent labs & imaging results that were available during my care of the patient were reviewed by me and considered in my medical decision making (see chart for  details).     Two days of diarrhea. Non toxic in appearance. Vitals stable. Afebrile. Still eating drinking and urinating. Supportive cares recommended. Does work in a factory setting. Work note provided. Return precautions provided. Patient verbalized understanding and agreeable to plan.   Final Clinical Impressions(s) / UC Diagnoses   Final diagnoses:  Diarrhea, unspecified type     Discharge Instructions     Bland diet as tolerated.  See recommended food choices for diarrhea, avoid spicy food.  Zofran every 8 hours as needed for nausea or vomiting.   May use pepto bismol as needed for diarrhea.  Please go to the ER if develop dizziness, weakness, no urine output in 8 hours, fevers, blood in stool or otherwise worsening.     ED Prescriptions    Medication Sig Dispense Auth. Provider   bismuth subsalicylate (PEPTO BISMOL) 262 MG/15ML suspension Take 30 mLs by mouth every 6 (six) hours as needed. 360 mL Burky, Dorene GrebeNatalie B, NP   ondansetron (ZOFRAN-ODT) 4 MG disintegrating tablet Take 1 tablet (4 mg total) by mouth every 8 (eight) hours as needed for nausea or vomiting. 12 tablet Georgetta HaberBurky, Natalie B, NP     Controlled Substance Prescriptions [place] Controlled Substance Registry consulted? Not Applicable   Georgetta HaberBurky, Natalie B, NP 03/02/19 [phone]
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
## 2019-03-02 NOTE — Discharge Instructions (Signed)
Bland diet as tolerated.  See recommended food choices for diarrhea, avoid spicy food.  Zofran every 8 hours as needed for nausea or vomiting.   May use pepto bismol as needed for diarrhea.  Please go to the ER if develop dizziness, weakness, no urine output in 8 hours, fevers, blood in stool or otherwise worsening.

## 2019-03-02 NOTE — ED Notes (Signed)
Patient able to ambulate independently  

## 2019-04-04 ENCOUNTER — Ambulatory Visit (INDEPENDENT_AMBULATORY_CARE_PROVIDER_SITE_OTHER): Payer: Self-pay

## 2019-04-04 ENCOUNTER — Other Ambulatory Visit: Payer: Self-pay

## 2019-04-04 ENCOUNTER — Ambulatory Visit (HOSPITAL_COMMUNITY)
Admission: EM | Admit: 2019-04-04 | Discharge: 2019-04-04 | Disposition: A | Discharge status: Home / Self Care | Payer: Self-pay | Attending: Internal Medicine | Admitting: Internal Medicine

## 2019-04-04 ENCOUNTER — Encounter (HOSPITAL_COMMUNITY): Payer: Self-pay | Admitting: Emergency Medicine

## 2019-04-04 DIAGNOSIS — S6991XA Unspecified injury of right wrist, hand and finger(s), initial encounter: Secondary | ICD-10-CM

## 2019-04-04 DIAGNOSIS — M20011 Mallet finger of right finger(s): Secondary | ICD-10-CM

## 2019-04-04 MED ORDER — MELOXICAM 7.5 MG PO TABS: 7.5000 mg | ORAL_TABLET | Freq: Every day | ORAL | 0 refills | Status: DC

## 2019-04-04 NOTE — ED Triage Notes (Signed)
Patient presents to Urgent Care with complaints of pain and swelling to right pinky since injury at work one week ago. 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

## 2019-04-04 NOTE — ED Provider Notes (Signed)
[facility]    CSN: [id number] Arrival date & time: 04/04/19  [id number]     History   Chief Complaint Chief Complaint  Patient presents with  . Finger Injury    HPI Isaiah Suarez is a 28 y.o. male.   Isaiah Suarez presents with pain and swelling to right hand pinky finger. 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 His finger struck and was somewhat stuck in a chicken (?) which then moved down the line. Swelling since. No numbness or tingling. Pain is primarily with touch or aggravation to the area specifically but no baseline pain. Denies any previous injury to the finger. He is right handed. Hasn't taken any medications for symptoms. Without contributing medical history.     ROS per HPI, negative if not otherwise mentioned.      Past Medical History:  Diagnosis Date  . Peptic ulcer     There are no active problems to display for this patient.   History reviewed. No pertinent surgical history.     Home Medications    Prior to Admission medications   Medication Sig Start Date End Date Taking? Authorizing Provider  meloxicam (MOBIC) 7.5 MG tablet Take 1 tablet (7.5 mg total) by mouth daily. 04/04/19   Georgetta HaberBurky, Vanity Larsson B, NP    Family History Family History  Problem Relation Age of Onset  . Healthy Mother   . Healthy Father     Social History Social History   Tobacco Use  . Smoking status: Current Every Day Smoker    Packs/day: 0.50    Types: Cigars  . Smokeless tobacco: Never Used  Substance Use Topics  . Alcohol use: Yes  . Drug use: No     Allergies   Patient has no known allergies.   Review of Systems Review of Systems   Physical Exam Triage Vital Signs ED Triage Vitals  Enc Vitals Group     BP 04/04/19 1008 (!) 143/81     Pulse Rate 04/04/19 1008 79     Resp 04/04/19 1008 18     Temp 04/04/19 1008 98.6 F (37 C)     Temp Source 04/04/19 1008 Oral     SpO2 04/04/19 1008 99 %     Weight --      Height --     Head Circumference --      Peak Flow --      Pain Score 04/04/19 1038 4     Pain Loc --      Pain Edu? --      Excl. in GC? --    No data found.  Updated Vital Signs BP (!) 143/81 (BP Location: Left Arm) Comment: Reported BP to NP [place]  Pulse 79   Temp 98.6 F (37 C) (Oral)   Resp 18   SpO2 99%    Physical Exam Constitutional:      Appearance: He is well-developed.  Cardiovascular:     Rate and Rhythm: Normal rate and regular rhythm.  Pulmonary:     Effort: Pulmonary effort is normal.     Breath sounds: Normal breath sounds.  Musculoskeletal:     Right hand: He exhibits tenderness, bony tenderness, deformity and swelling. He exhibits normal range of motion, normal two-point discrimination, normal capillary refill and no laceration. Normal sensation noted. Normal strength noted.       Hands:     Comments: Swelling with mild tenderness over right hand pinky DIPjoint; patient  still with flexion and extension to the DIPJ when PIP is isolated; cap refill < 2 seconds  Skin:    General: Skin is warm and dry.  Neurological:     Mental Status: He is alert and oriented to person, place, and time.      UC Treatments / Results  Labs (all labs ordered are listed, but only abnormal results are displayed) Labs Reviewed - No data to display  EKG None  Radiology Dg Finger Little Right  Result Date: 04/04/2019 CLINICAL DATA:  Right little finger injury at work a week ago. EXAM: RIGHT LITTLE FINGER 2+V COMPARISON:  None. FINDINGS: No acute fracture or dislocation. Joint spaces are preserved. Bone mineralization is normal. Dorsal soft tissue swelling over the fifth middle phalanx and DIP joint. No radiopaque foreign body identified. IMPRESSION: 1. Dorsal soft tissue swelling of the little finger. No acute osseous abnormality. Electronically Signed   By: Obie Dredge M.D.   On: 04/04/2019 11:34    Procedures Procedures (including critical care time)  Medications  Ordered in UC Medications - No data to display  Initial Impression / Assessment and Plan / UC Course  I have reviewed the triage vital signs and the nursing notes.  Pertinent labs & imaging results that were available during my care of the patient were reviewed by me and considered in my medical decision making (see chart for details).     Appearance of mallet finger to right pinky finger, consistent with injury type. Splint applied with DIP in extension. Follow up with hand as needed. Patient verbalized understanding and agreeable to plan.   Final Clinical Impressions(s) / UC Diagnoses   Final diagnoses:  Mallet finger of right finger(s)     Discharge Instructions     Use of splint as provided.  Ice, elevation.  Meloxicam daily to help with pain.  Please follow up with a hand specialist for reevaluation.     ED Prescriptions    Medication Sig Dispense Auth. Provider   meloxicam (MOBIC) 7.5 MG tablet Take 1 tablet (7.5 mg total) by mouth daily. 20 tablet Georgetta Haber, NP     Controlled Substance Prescriptions [place] Controlled Substance Registry consulted? Not Applicable   Georgetta Haber, NP 04/04/19 1155

## 2019-04-04 NOTE — Discharge Instructions (Signed)
Use of splint as provided.  Ice, elevation.  Meloxicam daily to help with pain.  Please follow up with a hand specialist for reevaluation.

## 2019-04-12 ENCOUNTER — Other Ambulatory Visit: Payer: Self-pay

## 2019-04-12 ENCOUNTER — Encounter (HOSPITAL_COMMUNITY): Payer: Self-pay | Admitting: Emergency Medicine

## 2019-04-12 ENCOUNTER — Ambulatory Visit (HOSPITAL_COMMUNITY)
Admission: EM | Admit: 2019-04-12 | Discharge: 2019-04-12 | Disposition: A | Discharge status: Home / Self Care | Payer: Self-pay | Attending: Urgent Care | Admitting: Urgent Care

## 2019-04-12 DIAGNOSIS — K5900 Constipation, unspecified: Secondary | ICD-10-CM

## 2019-04-12 MED ORDER — POLYETHYLENE GLYCOL 3350 17 G PO PACK: 17.0000 g | PACK | Freq: Every day | ORAL | 0 refills | Status: AC | PRN

## 2019-04-12 NOTE — Discharge Instructions (Addendum)
Please use Miralax now that you have not had a bowel movement. If this does not work, then you need to use a fleet enema. A fleet enema is used through the anus to help if a laxative does not work. You can also use Colace (docusate) stool softener, twice a day for at least 1 week. If stools become loose, cut down to once a day for another week. If stools remain loose, cut back to 1 pill every other day for a third week. You can stop docusate thereafter and resume as needed for constipation.  To help reduce constipation and promote bowel health: 1. Drink at least 64 ounces of water each day 2. Eat plenty of fiber (fruits, vegetables, whole grains, legumes) 3. Be physically active or exercise including walking, jogging, swimming, yoga, etc. 4. For active constipation use a stool softener (docusate) or an osmotic laxative (like Miralax) each day, or as needed.  Salads - kale, spinach, cabbage, spring mix; use seeds like pumpkin seeds or sunflower seeds; you can also use 1-2 hard boiled eggs. Fruits - avocadoes, berries (blueberries, raspberries, blackberries), apples, oranges, pomegranate, grapefruit Vegetables - aspargus, cauliflower, broccoli, green beans, brussel spouts, bell peppers; stay away from starchy vegetables like potatoes, carrots, peas

## 2019-04-12 NOTE — ED Provider Notes (Signed)
  MRN: [id number] DOB: Jan 29, 1991  Subjective:   Isaiah Suarez is a 28 y.o. male presenting for 3 day history of no bowel movement. He has not had much fiber in his diet. Normally he has bowel movement per day that is soft, not hard, does not strain. Has not tried medications to help with his symptoms.  Patient has a history of ulcer and also constipation requiring enemas in the hospital.  His ulcer has resolved per patient.  No current facility-administered medications for this encounter.   Current Outpatient Medications:  .  meloxicam (MOBIC) 7.5 MG tablet, Take 1 tablet (7.5 mg total) by mouth daily., Disp: 20 tablet, Rfl: 0   No Known Allergies  Past Medical History:  Diagnosis Date  . Peptic ulcer     History reviewed. No pertinent surgical history.  ROS Denies fever, nausea, vomiting, abdominal pain, pelvic or perianal/rectal pain, hematuria, urinary changes.  Objective:   Vitals: BP 137/90   Pulse 63   Temp 98.7 F (37.1 C) (Oral)   Resp 16   SpO2 100%   Physical Exam Constitutional:      General: He is not in acute distress.    Appearance: Normal appearance. He is well-developed and normal weight. He is not ill-appearing, toxic-appearing or diaphoretic.  HENT:     Head: Normocephalic and atraumatic.     Right Ear: External ear normal.     Left Ear: External ear normal.     Nose: Nose normal.     Mouth/Throat:     Pharynx: Oropharynx is clear.  Eyes:     General: No scleral icterus.    Extraocular Movements: Extraocular movements intact.     Pupils: Pupils are equal, round, and reactive to light.  Cardiovascular:     Rate and Rhythm: Normal rate and regular rhythm.     Heart sounds: No murmur. No friction rub. No gallop.   Pulmonary:     Effort: Pulmonary effort is normal. No respiratory distress.     Breath sounds: No wheezing or rales.  Abdominal:     General: Bowel sounds are normal. There is no distension.     Palpations: Abdomen is soft. There is no  mass.     Tenderness: There is no abdominal tenderness. There is no guarding or rebound.  Skin:    General: Skin is warm and dry.  Neurological:     Mental Status: He is alert and oriented to person, place, and time.  Psychiatric:        Mood and Affect: Mood normal.        Behavior: Behavior normal.     Assessment and Plan :   Constipation, unspecified constipation type  Counseled patient for management of constipation and need for lifestyle modification including adding fiber to his diet.  He is going to try a laxative first and if that does not help him have a bowel movement will switch to an enema.  Counseled patient that not having an abdominal surgery lowers his risk for having a bowel obstruction, he is also still able to pass gas.  However, I reviewed ER precautions with him. Counseled patient on potential for adverse effects with medications prescribed/recommended today, ER and return-to-clinic precautions discussed, patient verbalized understanding.    Wallis Bamberg, [place] 04/12/19 [phone]

## 2019-04-12 NOTE — ED Triage Notes (Signed)
PT was not able to have a BM tues, Wednesday, and today. PT typically has a BM daily. PT reports abdominal tightness, denies abdominal pain.

## 2019-06-30 ENCOUNTER — Emergency Department (HOSPITAL_COMMUNITY)
Admission: EM | Admit: 2019-06-30 | Discharge: 2019-06-30 | Disposition: A | Discharge status: Home / Self Care | Payer: Self-pay | Attending: Emergency Medicine | Admitting: Emergency Medicine

## 2019-06-30 ENCOUNTER — Encounter (HOSPITAL_COMMUNITY): Payer: Self-pay | Admitting: *Deleted

## 2019-06-30 ENCOUNTER — Emergency Department (HOSPITAL_COMMUNITY): Payer: Self-pay

## 2019-06-30 ENCOUNTER — Other Ambulatory Visit: Payer: Self-pay

## 2019-06-30 DIAGNOSIS — Y999 Unspecified external cause status: Secondary | ICD-10-CM | POA: Insufficient documentation

## 2019-06-30 DIAGNOSIS — S01511A Laceration without foreign body of lip, initial encounter: Secondary | ICD-10-CM | POA: Insufficient documentation

## 2019-06-30 DIAGNOSIS — F17290 Nicotine dependence, other tobacco product, uncomplicated: Secondary | ICD-10-CM | POA: Insufficient documentation

## 2019-06-30 DIAGNOSIS — Y939 Activity, unspecified: Secondary | ICD-10-CM | POA: Insufficient documentation

## 2019-06-30 DIAGNOSIS — Y929 Unspecified place or not applicable: Secondary | ICD-10-CM | POA: Insufficient documentation

## 2019-06-30 DIAGNOSIS — R51 Headache: Secondary | ICD-10-CM | POA: Insufficient documentation

## 2019-06-30 DIAGNOSIS — S6991XA Unspecified injury of right wrist, hand and finger(s), initial encounter: Secondary | ICD-10-CM

## 2019-06-30 DIAGNOSIS — Z79899 Other long term (current) drug therapy: Secondary | ICD-10-CM | POA: Insufficient documentation

## 2019-06-30 DIAGNOSIS — S0181XA Laceration without foreign body of other part of head, initial encounter: Secondary | ICD-10-CM | POA: Insufficient documentation

## 2019-06-30 DIAGNOSIS — S6981XA Other specified injuries of right wrist, hand and finger(s), initial encounter: Secondary | ICD-10-CM | POA: Insufficient documentation

## 2019-06-30 MED ORDER — TETANUS-DIPHTH-ACELL PERTUSSIS 5-2.5-18.5 LF-MCG/0.5 IM SUSP: 0.5000 mL | Freq: Once | INTRAMUSCULAR | Status: DC

## 2019-06-30 MED ORDER — LIDOCAINE HCL (PF) 1 % IJ SOLN
10.0000 mL | Freq: Once | INTRAMUSCULAR | Status: DC
  Filled 2019-06-30: qty 10

## 2019-06-30 MED ORDER — ACETAMINOPHEN 325 MG PO TABS
650.0000 mg | ORAL_TABLET | Freq: Once | ORAL | Status: AC
  Administered 2019-06-30: 650 mg via ORAL
  Filled 2019-06-30: qty 2
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
## 2019-06-30 NOTE — ED Notes (Signed)
Patient transported to CT 
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
## 2021-06-23 ENCOUNTER — Ambulatory Visit (HOSPITAL_COMMUNITY)
Admission: EM | Admit: 2021-06-23 | Discharge: 2021-06-23 | Disposition: A | Discharge status: Home / Self Care | Payer: Self-pay | Attending: Family Medicine | Admitting: Family Medicine

## 2021-06-23 ENCOUNTER — Other Ambulatory Visit: Payer: Self-pay

## 2021-06-23 ENCOUNTER — Encounter (HOSPITAL_COMMUNITY): Payer: Self-pay | Admitting: *Deleted

## 2021-06-23 DIAGNOSIS — K047 Periapical abscess without sinus: Secondary | ICD-10-CM

## 2021-06-23 MED ORDER — AMOXICILLIN-POT CLAVULANATE 875-125 MG PO TABS: 1.0000 | ORAL_TABLET | Freq: Two times a day (BID) | ORAL | 0 refills | Status: AC

## 2021-06-23 MED ORDER — NAPROXEN 500 MG PO TABS: 500.0000 mg | ORAL_TABLET | Freq: Two times a day (BID) | ORAL | 0 refills | Status: AC

## 2021-06-23 NOTE — ED Triage Notes (Addendum)
Pt reports dental pain to two teeth on the RT side.

## 2021-06-23 NOTE — Discharge Instructions (Addendum)
We will treat your dental infection with antibiotics and anti-inflammatory medication to help with your pain.  Ultimately, you need to be seen by a dentist to have the appropriate procedures performed to clear your infection and keep this from recurring.  We will provide you with a list of low-cost dental resources in the area.  If you have difficulty breathing, difficulty swallowing, significant pain or worsening of your symptoms, especially if you have pain with movement of your neck, he should be seen at the emergency room right away.  With the anti-inflammatory medicine that we are giving you, you should not take this with over-the-counter ibuprofen, Aleve, Advil.

## 2021-06-23 NOTE — ED Provider Notes (Signed)
[facility]    CSN: [id number] Arrival date & time: 06/23/21  1302      History   Chief Complaint Chief Complaint  Patient presents with   Dental Pain    HPI Isaiah Suarez is a 30 y.o. male.   Tooth Pain Started last night In right bottom molar and right upper molar No difficulty breathing Has noticed a "knot" on the right side of his neck No trouble swallowing, no difficulty breathing No pain with neck movement, but can feel the knot Has had similar pain on left side and had a tooth removed by a dentist No longer has insurance, therefore hasn't gone to the dentist No fevers Pain with eating and sensitivity with cold   Past Medical History:  Diagnosis Date   Peptic ulcer     There are no problems to display for this patient.   History reviewed. No pertinent surgical history.     Home Medications    Prior to Admission medications   Medication Sig Start Date End Date Taking? Authorizing Provider  amoxicillin-clavulanate (AUGMENTIN) 875-125 MG tablet Take 1 tablet by mouth every 12 (twelve) hours for 10 days. 06/23/21 07/03/21 Yes Antwone Capozzoli, Solmon Ice, DO  naproxen (NAPROSYN) 500 MG tablet Take 1 tablet (500 mg total) by mouth 2 (two) times daily. 06/23/21  Yes Kellie Murrill, Solmon Ice, DO  polyethylene glycol (MIRALAX / GLYCOLAX) 17 g packet Take 17 g by mouth daily as needed for moderate constipation or severe constipation. Patient not taking: Reported on 06/30/2019 04/12/19   Wallis Bamberg, PA-C    Family History Family History  Problem Relation Age of Onset   Healthy Mother    Healthy Father     Social History Social History   Tobacco Use   Smoking status: Every Day    Packs/day: 0.50    Types: Cigars, Cigarettes   Smokeless tobacco: Never  Vaping Use   Vaping Use: Never used  Substance Use Topics   Alcohol use: Yes   Drug use: No     Allergies   Patient has no known allergies.   Review of Systems Review of Systems  Constitutional:   Negative for chills, fatigue and fever.  HENT:  Positive for dental problem. Negative for congestion, drooling, sore throat and trouble swallowing.   Eyes:  Negative for pain.  Respiratory:  Negative for shortness of breath.   Cardiovascular:  Negative for chest pain.  Gastrointestinal:  Negative for abdominal pain.  Genitourinary:  Negative for difficulty urinating.  Musculoskeletal:  Negative for neck pain and neck stiffness.  Skin:  Negative for rash.  Neurological:  Negative for dizziness.    Physical Exam Triage Vital Signs ED Triage Vitals  Enc Vitals Group     BP      Pulse      Resp      Temp      Temp src      SpO2      Weight      Height      Head Circumference      Peak Flow      Pain Score      Pain Loc      Pain Edu?      Excl. in GC?    No data found.  Updated Vital Signs BP 114/76   Pulse (!) 51   Temp 98 F (36.7 C)   Resp 18   SpO2 100%   Visual Acuity Right Eye Distance:   Left Eye  Distance:   Bilateral Distance:    Right Eye Near:   Left Eye Near:    Bilateral Near:     Physical Exam Constitutional:      General: He is not in acute distress.    Appearance: Normal appearance. He is not ill-appearing or toxic-appearing.  HENT:     Head: Normocephalic and atraumatic.     Mouth/Throat:     Pharynx: Oropharynx is clear. Uvula midline. No pharyngeal swelling.   Cardiovascular:     Rate and Rhythm: Normal rate and regular rhythm.  Pulmonary:     Effort: Pulmonary effort is normal. No respiratory distress.     Breath sounds: Normal breath sounds. No wheezing, rhonchi or rales.  Musculoskeletal:     Cervical back: No rigidity. No pain with movement.  Lymphadenopathy:     Cervical: Cervical adenopathy (right sided, tender, moveable) present.  Skin:    General: Skin is warm and dry.  Neurological:     Mental Status: He is alert and oriented to person, place, and time.  Psychiatric:        Mood and Affect: Mood normal.        Behavior:  Behavior normal.        Thought Content: Thought content normal.        Judgment: Judgment normal.     UC Treatments / Results  Labs (all labs ordered are listed, but only abnormal results are displayed) Labs Reviewed - No data to display  EKG   Radiology No results found.  Procedures Procedures (including critical care time)  Medications Ordered in UC Medications - No data to display  Initial Impression / Assessment and Plan / UC Course  I have reviewed the triage vital signs and the nursing notes.  Pertinent labs & imaging results that were available during my care of the patient were reviewed by me and considered in my medical decision making (see chart for details).     Patient is a 30 year old previously healthy male who presents with dental pain, likely secondary to dental abscess and poor dental hygiene.  Discussed with patient that ultimately treatment would be required from a dentist, he was given a list of low-cost dental care in the area.  He does not have any symptoms suggestive of a deeper infection.  He was given ED precautions, see AVS.  We will treat him with Augmentin for 10 days and naproxen for pain.  Advised to not use naproxen with other NSAIDs.  He was discharged home in stable condition.   Final Clinical Impressions(s) / UC Diagnoses   Final diagnoses:  Dental abscess     Discharge Instructions      We will treat your dental infection with antibiotics and anti-inflammatory medication to help with your pain.  Ultimately, you need to be seen by a dentist to have the appropriate procedures performed to clear your infection and keep this from recurring.  We will provide you with a list of low-cost dental resources in the area.  If you have difficulty breathing, difficulty swallowing, significant pain or worsening of your symptoms, especially if you have pain with movement of your neck, he should be seen at the emergency room right away.  With the  anti-inflammatory medicine that we are giving you, you should not take this with over-the-counter ibuprofen, Aleve, Advil.     ED Prescriptions     Medication Sig Dispense Auth. Provider   naproxen (NAPROSYN) 500 MG tablet Take 1 tablet (500 mg total)  by mouth 2 (two) times daily. 30 tablet Taleeya Blondin, Solmon Ice, DO   amoxicillin-clavulanate (AUGMENTIN) 875-125 MG tablet Take 1 tablet by mouth every 12 (twelve) hours for 10 days. 20 tablet Elford Evilsizer, Solmon Ice, DO      PDMP not reviewed this encounter.   Unknown Jim, DO 06/23/21 1449

## 2022-09-08 ENCOUNTER — Emergency Department (HOSPITAL_COMMUNITY)
Admission: EM | Admit: 2022-09-08 | Discharge: 2022-09-08 | Disposition: A | Discharge status: Home / Self Care | Payer: Self-pay | Attending: Emergency Medicine | Admitting: Emergency Medicine

## 2022-09-08 ENCOUNTER — Other Ambulatory Visit: Payer: Self-pay

## 2022-09-08 ENCOUNTER — Encounter (HOSPITAL_COMMUNITY): Payer: Self-pay | Admitting: Emergency Medicine

## 2022-09-08 DIAGNOSIS — S61412A Laceration without foreign body of left hand, initial encounter: Secondary | ICD-10-CM | POA: Insufficient documentation

## 2022-09-08 DIAGNOSIS — W25XXXA Contact with sharp glass, initial encounter: Secondary | ICD-10-CM | POA: Insufficient documentation

## 2022-09-08 DIAGNOSIS — Z23 Encounter for immunization: Secondary | ICD-10-CM | POA: Insufficient documentation

## 2022-09-08 MED ORDER — LIDOCAINE HCL (PF) 1 % IJ SOLN
30.0000 mL | Freq: Once | INTRAMUSCULAR | Status: AC
Start: 2022-09-08 — End: 2022-09-08
  Administered 2022-09-08: 30 mL
  Filled 2022-09-08: qty 30

## 2022-09-08 MED ORDER — TETANUS-DIPHTH-ACELL PERTUSSIS 5-2.5-18.5 LF-MCG/0.5 IM SUSY
0.5000 mL | PREFILLED_SYRINGE | Freq: Once | INTRAMUSCULAR | Status: AC
  Administered 2022-09-08: 0.5 mL via INTRAMUSCULAR
  Filled 2022-09-08: qty 0.5

## 2022-09-08 MED ORDER — OXYCODONE-ACETAMINOPHEN 5-325 MG PO TABS
1.0000 | ORAL_TABLET | Freq: Once | ORAL | Status: AC
  Administered 2022-09-08: 1 via ORAL
  Filled 2022-09-08: qty 1
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
## 2022-09-08 NOTE — ED Notes (Signed)
Wound cleansed, wound care completed. Bacitracin applied to wound, dressed with nonadherent dressing and wrapped.
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
## 2022-09-08 NOTE — ED Notes (Signed)
Pt verbalizes understanding of discharge instructions. Opportunity for questions and answers were provided. Pt discharged from the ED.   ?

## 2022-09-08 NOTE — Discharge Instructions (Addendum)
Please use Tylenol or ibuprofen for pain.  You may use 600 mg ibuprofen every 6 hours or 1000 mg of Tylenol every 6 hours.  You may choose to alternate between the 2.  This would be most effective.  Not to exceed 4 g of Tylenol within 24 hours.  Not to exceed 3200 mg ibuprofen 24 hours.  Please monitor for signs of infection including worsening redness, pain, swelling, pus draining from wound site. Please return to the ED if you experience any of these symptoms.

## 2023-03-29 ENCOUNTER — Emergency Department (HOSPITAL_COMMUNITY)
Admission: EM | Admit: 2023-03-29 | Discharge: 2023-03-30 | Disposition: A | Discharge status: Home / Self Care | Payer: Self-pay | Attending: Emergency Medicine | Admitting: Emergency Medicine

## 2023-03-29 DIAGNOSIS — S0993XA Unspecified injury of face, initial encounter: Secondary | ICD-10-CM

## 2023-03-29 DIAGNOSIS — S025XXA Fracture of tooth (traumatic), initial encounter for closed fracture: Secondary | ICD-10-CM | POA: Insufficient documentation

## 2023-03-29 DIAGNOSIS — X58XXXA Exposure to other specified factors, initial encounter: Secondary | ICD-10-CM | POA: Insufficient documentation

## 2023-03-30 ENCOUNTER — Other Ambulatory Visit: Payer: Self-pay

## 2023-03-30 ENCOUNTER — Encounter (HOSPITAL_COMMUNITY): Payer: Self-pay

## 2023-03-30 MED ORDER — IBUPROFEN 200 MG PO TABS
600.0000 mg | ORAL_TABLET | Freq: Once | ORAL | Status: AC
  Administered 2023-03-30: 600 mg via ORAL
  Filled 2023-03-30: qty 3

## 2023-03-30 NOTE — Discharge Instructions (Addendum)
Please return to the ED with any new or worsening symptoms. Please follow-up with Dr. Mia Creek of dentistry.  You may also use the dental resource card provided and follow-up with any dentist on this form. Please read the attached guide concerning tooth displacement Please follow directions regarding tooth displacement. You may use ibuprofen or Tylenol for pain control until seen by dentistry
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
## 2023-11-23 ENCOUNTER — Emergency Department (HOSPITAL_COMMUNITY): Payer: Self-pay

## 2023-11-23 ENCOUNTER — Emergency Department (HOSPITAL_COMMUNITY)
Admission: EM | Admit: 2023-11-23 | Discharge: 2023-11-23 | Disposition: A | Discharge status: Home / Self Care | Payer: Self-pay | Attending: Emergency Medicine | Admitting: Emergency Medicine

## 2023-11-23 ENCOUNTER — Other Ambulatory Visit: Payer: Self-pay

## 2023-11-23 DIAGNOSIS — X58XXXA Exposure to other specified factors, initial encounter: Secondary | ICD-10-CM | POA: Insufficient documentation

## 2023-11-23 DIAGNOSIS — S3992XA Unspecified injury of lower back, initial encounter: Secondary | ICD-10-CM | POA: Diagnosis present

## 2023-11-23 DIAGNOSIS — S21212A Laceration without foreign body of left back wall of thorax without penetration into thoracic cavity, initial encounter: Secondary | ICD-10-CM | POA: Insufficient documentation

## 2023-11-23 LAB — COMPREHENSIVE METABOLIC PANEL
ALT: 21 U/L (ref 0–44)
AST: 36 U/L (ref 15–41)
Albumin: 4.5 g/dL (ref 3.5–5.0)
Alkaline Phosphatase: 55 U/L (ref 38–126)
Anion gap: 16 — ABNORMAL HIGH (ref 5–15)
BUN: 5 mg/dL — ABNORMAL LOW (ref 6–20)
CO2: 21 mmol/L — ABNORMAL LOW (ref 22–32)
Calcium: 9.5 mg/dL (ref 8.9–10.3)
Chloride: 104 mmol/L (ref 98–111)
Creatinine, Ser: 1.25 mg/dL — ABNORMAL HIGH (ref 0.61–1.24)
GFR, Estimated: >60 mL/min (ref >60)
Glucose, Bld: 162 mg/dL — ABNORMAL HIGH (ref 70–99)
Potassium: 3.1 mmol/L — ABNORMAL LOW (ref 3.5–5.1)
Sodium: 141 mmol/L (ref 135–145)
Total Bilirubin: 0.6 mg/dL (ref 0.0–1.2)
Total Protein: 7.6 g/dL (ref 6.5–8.1)

## 2023-11-23 LAB — I-STAT CHEM 8, ED
BUN: 5 mg/dL — ABNORMAL LOW (ref 6–20)
Calcium, Ion: 1.1 mmol/L — ABNORMAL LOW (ref 1.15–1.40)
Chloride: 106 mmol/L (ref 98–111)
Creatinine, Ser: 1.4 mg/dL — ABNORMAL HIGH (ref 0.61–1.24)
Glucose, Bld: 157 mg/dL — ABNORMAL HIGH (ref 70–99)
HCT: 48 % (ref 39.0–52.0)
Hemoglobin: 16.3 g/dL (ref 13.0–17.0)
Potassium: 3.2 mmol/L — ABNORMAL LOW (ref 3.5–5.1)
Sodium: 143 mmol/L (ref 135–145)
TCO2: 22 mmol/L (ref 22–32)

## 2023-11-23 LAB — CBC WITH DIFFERENTIAL/PLATELET
Abs Immature Granulocytes: 0.02 10*3/uL (ref 0.00–0.07)
Basophils Absolute: 0 10*3/uL (ref 0.0–0.1)
Basophils Relative: 0 %
Eosinophils Absolute: 0 10*3/uL (ref 0.0–0.5)
Eosinophils Relative: 0 %
HCT: 43.8 % (ref 39.0–52.0)
Hemoglobin: 15.1 g/dL (ref 13.0–17.0)
Immature Granulocytes: 0 %
Lymphocytes Relative: 49 %
Lymphs Abs: 3.3 10*3/uL (ref 0.7–4.0)
MCH: 31.7 pg (ref 26.0–34.0)
MCHC: 34.5 g/dL (ref 30.0–36.0)
MCV: 91.8 fL (ref 80.0–100.0)
Monocytes Absolute: 0.5 10*3/uL (ref 0.1–1.0)
Monocytes Relative: 7 %
Neutro Abs: 2.9 10*3/uL (ref 1.7–7.7)
Neutrophils Relative %: 44 %
Platelets: 265 10*3/uL (ref 150–400)
RBC: 4.77 MIL/uL (ref 4.22–5.81)
RDW: 13.2 % (ref 11.5–15.5)
WBC: 6.7 10*3/uL (ref 4.0–10.5)
nRBC: 0 % (ref 0.0–0.2)

## 2023-11-23 MED ORDER — IOHEXOL 350 MG/ML SOLN
75.0000 mL | Freq: Once | INTRAVENOUS | Status: AC | PRN
  Administered 2023-11-23: 75 mL via INTRAVENOUS

## 2023-11-23 NOTE — Progress Notes (Signed)
 Orthopedic Tech Progress Note Patient Details:  Isaiah Suarez 08/07/1991 [id number]  Patient ID: Benton Bodkin, male   DOB: June 10, 1991, 33 y.o.   MRN: [id number] Level I; not needed. Toi Foster 11/23/2023, 2:58 AM

## 2023-11-23 NOTE — ED Notes (Signed)
 Patient transported to CT

## 2023-11-23 NOTE — ED Notes (Signed)
 Pt verbalized understanding of discharge instructions. Pt ambulatory at time of discharge. Pt refused discharge vitals. IV removed. Pt looking for his white shirt. Rn searched Research scientist (medical) in the room. Rn also inquired at CT and to provider about white shirt. Rn unable to locate shirt. Pt currently wearing jeans, shirt, jacket and sneakers. Pt also inquiring about his hat that we were unable to find. Pt escorted from ed by security.
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
## 2023-11-23 NOTE — ED Notes (Signed)
 Pt on the phone currently and agitated. Pt is requesting to leave.
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
## 2023-11-23 NOTE — ED Notes (Signed)
 On arrival to facility, pt did not have a white shirt or hat on. Pt had gray shorts, stripe shirt and jeans on.

## 2023-11-23 NOTE — ED Notes (Signed)
 Pt states he has to leave to take care of his son. Pt reports that his baby sitter is leaving.

## 2024-11-25 ENCOUNTER — Emergency Department (HOSPITAL_COMMUNITY)
Admission: EM | Admit: 2024-11-25 | Discharge: 2024-11-25 | Disposition: A | Discharge status: Home / Self Care | Attending: Emergency Medicine | Admitting: Emergency Medicine

## 2024-11-25 DIAGNOSIS — S01511A Laceration without foreign body of lip, initial encounter: Secondary | ICD-10-CM | POA: Insufficient documentation

## 2024-11-25 DIAGNOSIS — M542 Cervicalgia: Secondary | ICD-10-CM | POA: Insufficient documentation

## 2024-11-25 DIAGNOSIS — R519 Headache, unspecified: Secondary | ICD-10-CM | POA: Insufficient documentation

## 2024-11-25 DIAGNOSIS — S025XXA Fracture of tooth (traumatic), initial encounter for closed fracture: Secondary | ICD-10-CM | POA: Insufficient documentation

## 2024-11-25 DIAGNOSIS — S0993XA Unspecified injury of face, initial encounter: Secondary | ICD-10-CM

## 2024-11-25 DIAGNOSIS — Y249XXA Unspecified firearm discharge, undetermined intent, initial encounter: Secondary | ICD-10-CM

## 2024-11-25 MED ADMIN — AMPICILLIN-SULBACTAM 3 GM IVPB: 3 g | INTRAVENOUS | NDC 55150011720

## 2024-11-25 MED ADMIN — Lidocaine HCl Local Preservative Free (PF) Inj 1%: 10 mL | NDC 00409471302

## 2024-11-25 MED ADMIN — Iohexol IV Soln 350 MG/ML: 75 mL | INTRAVENOUS | NDC 00407141490

## 2024-11-25 MED ADMIN — Sodium Chloride IV Soln 0.9%: 1000 mL | INTRAVENOUS | NDC 00264580200

## 2024-11-25 MED ADMIN — Fentanyl Citrate PF Soln Prefilled Syringe 50 MCG/ML: 50 ug | INTRAVENOUS | NDC 63323080801

## 2024-11-26 ENCOUNTER — Encounter (HOSPITAL_COMMUNITY): Payer: Self-pay

## 2024-11-26 ENCOUNTER — Telehealth: Payer: Self-pay

## 2024-11-26 NOTE — Telephone Encounter (Signed)
 Pt called regarding which pharmacy Rx was e-scribed to.  RNCM reviewed chart to access After Visit Summary and found that Rx was [facility] at [facility].  Advised pt to pick up at their convenience.

## 2024-12-06 ENCOUNTER — Encounter (HOSPITAL_COMMUNITY): Payer: Self-pay

## 2024-12-10 ENCOUNTER — Ambulatory Visit (HOSPITAL_COMMUNITY): Payer: Self-pay

## 2024-12-10 ENCOUNTER — Encounter (HOSPITAL_COMMUNITY): Payer: Self-pay
